# Patient Record
Sex: Female | Born: 1940 | Race: White | Hispanic: No | Marital: Married | Smoking: Never smoker
Health system: Southern US, Community
[De-identification: ages and names within clinical notes are randomized; demographics above are authoritative.]

## PROBLEM LIST (undated history)

## (undated) DIAGNOSIS — C541 Malignant neoplasm of endometrium: Secondary | ICD-10-CM

## (undated) DIAGNOSIS — M1990 Unspecified osteoarthritis, unspecified site: Secondary | ICD-10-CM

## (undated) DIAGNOSIS — D649 Anemia, unspecified: Secondary | ICD-10-CM

## (undated) DIAGNOSIS — F419 Anxiety disorder, unspecified: Secondary | ICD-10-CM

## (undated) DIAGNOSIS — M898X5 Other specified disorders of bone, thigh: Secondary | ICD-10-CM

## (undated) DIAGNOSIS — G8929 Other chronic pain: Secondary | ICD-10-CM

## (undated) DIAGNOSIS — M545 Other chronic pain: Secondary | ICD-10-CM

## (undated) DIAGNOSIS — C7951 Secondary malignant neoplasm of bone: Secondary | ICD-10-CM

## (undated) DIAGNOSIS — C50919 Malignant neoplasm of unspecified site of unspecified female breast: Secondary | ICD-10-CM

## (undated) DIAGNOSIS — I10 Essential (primary) hypertension: Secondary | ICD-10-CM

## (undated) DIAGNOSIS — M792 Neuralgia and neuritis, unspecified: Secondary | ICD-10-CM

## (undated) DIAGNOSIS — J189 Pneumonia, unspecified organism: Secondary | ICD-10-CM

## (undated) DIAGNOSIS — F418 Other specified anxiety disorders: Secondary | ICD-10-CM

## (undated) DIAGNOSIS — F32A Depression, unspecified: Secondary | ICD-10-CM

## (undated) DIAGNOSIS — F329 Major depressive disorder, single episode, unspecified: Secondary | ICD-10-CM

## (undated) DIAGNOSIS — G709 Myoneural disorder, unspecified: Secondary | ICD-10-CM

## (undated) DIAGNOSIS — E7800 Pure hypercholesterolemia, unspecified: Secondary | ICD-10-CM

## (undated) HISTORY — DX: Malignant neoplasm of unspecified site of unspecified female breast: C50.919

## (undated) HISTORY — DX: Unspecified osteoarthritis, unspecified site: M19.90

## (undated) HISTORY — DX: Anemia, unspecified: D64.9

## (undated) HISTORY — DX: Malignant neoplasm of endometrium: C54.1

## (undated) HISTORY — DX: Pneumonia, unspecified organism: J18.9

## (undated) HISTORY — DX: Anxiety disorder, unspecified: F41.9

## (undated) HISTORY — PX: HAND SURGERY: SHX662

## (undated) HISTORY — DX: Other specified disorders of bone, thigh: M89.8X5

## (undated) HISTORY — DX: Major depressive disorder, single episode, unspecified: F32.9

## (undated) HISTORY — DX: Other specified anxiety disorders: F41.8

## (undated) HISTORY — DX: Depression, unspecified: F32.A

## (undated) HISTORY — PX: TOTAL ABDOMINAL HYSTERECTOMY W/ BILATERAL SALPINGOOPHORECTOMY: SHX83

## (undated) HISTORY — DX: Neuralgia and neuritis, unspecified: M79.2

## (undated) HISTORY — DX: Secondary malignant neoplasm of bone: C79.51

---

## 2007-02-11 ENCOUNTER — Encounter: Payer: Self-pay | Admitting: Physical Medicine & Rehabilitation

## 2007-02-14 ENCOUNTER — Encounter: Payer: Self-pay | Admitting: Physical Medicine & Rehabilitation

## 2007-03-17 ENCOUNTER — Encounter: Payer: Self-pay | Admitting: Physical Medicine & Rehabilitation

## 2007-08-05 ENCOUNTER — Encounter: Payer: Self-pay | Admitting: Physical Medicine & Rehabilitation

## 2007-08-17 ENCOUNTER — Encounter: Payer: Self-pay | Admitting: Physical Medicine & Rehabilitation

## 2007-09-16 ENCOUNTER — Encounter: Payer: Self-pay | Admitting: Physical Medicine & Rehabilitation

## 2015-06-30 ENCOUNTER — Telehealth: Payer: Self-pay | Admitting: *Deleted

## 2015-06-30 NOTE — Telephone Encounter (Signed)
Received referral from pt's PCP requesting an appt w/ Dr. Jana Hakim.  Called and left a message for the pt to return my call so I can schedule her.

## 2015-07-01 ENCOUNTER — Telehealth: Payer: Self-pay | Admitting: *Deleted

## 2015-07-01 NOTE — Telephone Encounter (Signed)
Called pt and I confirmed 07/05/15 appt w/ her.  Unable to mail packet - gave verbal, directions, instruction and placed a note for an intake form to be given at time of check in.  Called and left a message at referring to make them aware.  Placed a copy of the records in Dr. Virgie Dad box and took one to HIM to scan.

## 2015-07-02 ENCOUNTER — Other Ambulatory Visit: Payer: Self-pay | Admitting: *Deleted

## 2015-07-02 DIAGNOSIS — C50919 Malignant neoplasm of unspecified site of unspecified female breast: Secondary | ICD-10-CM

## 2015-07-04 DIAGNOSIS — Z170 Estrogen receptor positive status [ER+]: Secondary | ICD-10-CM | POA: Insufficient documentation

## 2015-07-04 DIAGNOSIS — C50412 Malignant neoplasm of upper-outer quadrant of left female breast: Secondary | ICD-10-CM | POA: Insufficient documentation

## 2015-07-04 NOTE — Progress Notes (Signed)
[facility]  [phone] [phone]     ID: Angel Palmer Palmer DOB: 1941-02-26  MR#: [id number]  HGD#:[id number]  Patient Care Team: Angel Palmer Dana, MD as PCP - General (Internal Medicine) Angel Palmer Cruel, MD as Consulting Physician (Oncology) PCP: Angel Palmer Dana, MD GYN: Angel Palmer Drown MD, SU: Angel Palmer Keens MD OTHER MD: Angel Palmer Prows PA-C  CHIEF COMPLAINT: Estrogen receptor positive breast cancer; history of endometrial cancer  CURRENT TREATMENT: Undergoing evaluation   BREAST CANCER HISTORY: Angel Palmer Palmer had a little bit of itching in her left breast area which led her to palpate a mass there. She brought this to her primary care physician's attention. Left diagnostic mammography with ultrasonography at [facility] diagnostic imaging 06/07/2015 showed an area of asymmetry in the left breast which was not seen in exams from June 2015 or May 2013. A second area of asymmetry in the inner aspect of the left breast was stable. The breast density was category C. Ultrasound of the left breast showed a hypoechoic irregular mass at the 1:00 position near the edge of the areola, measuring 1.4 cm maximally. There was a fatty replaced lymph node in the axilla measuring 2 cm.  Biopsy of this area was obtained 06/09/2015, and showed ([id number]) and invasive ductal carcinoma with neuroendocrine features, grade 2, with no evidence of lymphovascular invasion. Estrogen and progesterone were both positive in greater than 50% of the nuclei. HER-2 stain was negative. E-cadherin was strongly positive. Angel Palmer 67 was 20%. P53 was weakly reactive in approximately 2% of the nuclei. Immunostains for neuroendocrine features showed week focal positivity for signed up to 57 and CD 56. It was nonreactive for chromogranin and neuron specific enolase  The patient's subsequent history is as detailed below   HISTORY OF ENDOMETRIAL CANCER: From Dr Angel Palmer Palmer 03/25/2015 summary  note: Angel Palmer Palmer underwent laparoscopically assisted vaginal hysterectomy, bilateral salpingo oophorectomy, and laparoscopic pelvic lymph node dissection in November, 1995. Final pathology revealed a moderately differentiated endometrioid adenocarcinoma. There was extensive involvement of the endometrial surface, but the maximal depth of invasion was less than 1 mm. Since she had Stage I-B, Grade 2, endometrial carcinoma with very minimal invasion and negative nodes, we elected not to treat her with any adjuvant therapy. She did well until January, 2000 when she developed right hip pain and was found to have a lytic lesion involving the right acetabulum. A CT scan showed 2 soft tissue masses in the pelvis, one of which was on the side wall involving the right hip, the second being more medially in the right peri-rectal space. Fine needle aspirate confirmed recurrence of endometrial cancer and she was treated with external pelvic radiation, which she completed in May, 2000. She had an excellent response to therapy with complete disappearance of the disease palpable on pelvic exam. Because of the bony involvement she has continued difficulties walking and uses a cane. In addition, she required intermittent narcotics for pain relief. She received adjuvant Megace therapy for 5 years.    INTERVAL HISTORY: Angel Palmer Palmer was evaluated in the breast clinic 07/05/2015 accompanied by her daughter Angel Palmer Palmer.:  REVIEW OF SYSTEMS: Aside from the mass itself, there have been no Angel Palmer symptoms recently of concern. She does have chronic low back pain for which she uses tramadol regularly and Percocet rarely. She is not constipated from these medications. She has been having diarrhea for some time, in a very irregular pattern. This does not seem to be related to different foods. She has had several  falls in the last 2 years. She thinks particularly her right toes sometimes stumbles. Of course she had significant radiation  to the pelvis and bones and this could rub percent a degree of neuropathy or myelopathy. She describes herself as frequently fatigued and describes the back pain as stabbing throbbing and aching. Aside from these issues she has sinus problems, shortness of breath particularly when walking up stairs, and concerns regarding skin cancers particularly in her for head but also in the area of prior radiation. A detailed review of systems otherwise showed no Angel Palmer symptoms.  PAST MEDICAL HISTORY: Past Medical History  Diagnosis Date  . Endometrial/uterine adenocarcinoma   . Metastasis to bone   . 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 Osteoarthritis   . Depression with anxiety      PAST SURGICAL HISTORY: Past Surgical History  Procedure Laterality Date  . Total abdominal hysterectomy w/ bilateral salpingoophorectomy      1995  . Hand surgery     FAMILY HISTORY No family history on file. the patient's father died from a stroke at age 46. The patient's mother died from colon cancer at age 84. The patient had one brother, 4 sisters. 2 of the patient's sisters had breast cancer, one had ovarian cancer diagnosed age 45 and the other one had endometrial cancer. Note that the patient's daughter has been tested for the BRCA mutation and is negative   GYNECOLOGIC HISTORY:  No LMP recorded. Menarche age 57, first live birth age 24. The patient underwent TAH/BSO in 1995. She did not take hormone replacement. 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 She is widowed and lives by herself with a long haired she while. Her daughter him early Angel Palmer Palmer lives in [place] and works as a Research scientist (physical sciences). She had noninvasive breast cancer diagnosed in 2014. The patient has 1 grandchild.    ADVANCED DIRECTIVES: The patient has a living will in place but  not a healthcare part of attorney documents. At the initial clinic visit 07/05/2015 the patient was given the appropriate forms to complete and notarize at her discretion.  HEALTH MAINTENANCE: Social History  Substance Use Topics  . Smoking status: Not on file  . Smokeless tobacco: Not on file  . Alcohol Use: Not on file     Colonoscopy: 2014  PAP: Status post hysterectomy  Bone density: 2012  Lipid panel:  Allergies not on file  No current outpatient prescriptions on file.   No current facility-administered medications for this visit.    OBJECTIVE: Older white Palmer in no acute distress Filed Vitals:   07/05/15 1611  BP: 149/76  Pulse: 81  Temp: 97.8 F (36.6 C)  Resp: 18     Body mass index is 20.22 kg/(m^2).    ECOG FS:1 - Symptomatic but completely ambulatory  Ocular: Sclerae unicteric, pupils equal, round and reactive to light Ear-nose-throat: Oropharynx clear and moist Lymphatic: No cervical or supraclavicular adenopathy Lungs no rales or rhonchi, good excursion bilaterally Heart regular rate and rhythm, no murmur appreciated Abd soft, nontender, positive bowel sounds MSK scoliosis but no focal spinal tenderness, no joint edema Neuro: non-focal, well-oriented, appropriate affect Breasts: The right breast is unremarkable. The left breast is status post recent biopsy. There is no palpable mass. There are no skin or nipple changes of concern. The left axilla is benign.  LAB RESULTS:  CMP     Component Value Date/Time   NA 143 07/05/2015 1530   K 3.9 07/05/2015 1530   CO2 30* 07/05/2015 1530   GLUCOSE 83 07/05/2015 1530   BUN 17.5 07/05/2015 1530   CREATININE 0.8 07/05/2015 1530   CALCIUM 9.2 07/05/2015 1530   PROT 6.4 07/05/2015 1530   ALBUMIN 3.6 07/05/2015 1530   AST 27 07/05/2015 1530   ALT 24 07/05/2015 1530   ALKPHOS 100 07/05/2015 1530   BILITOT 0.34 07/05/2015 1530    INo results found for: SPEP, UPEP  Lab Results  Component Value Date    WBC 6.8 07/05/2015   NEUTROABS 4.1 07/05/2015   HGB 11.1* 07/05/2015   HCT 34.8 07/05/2015   MCV 80.9 07/05/2015   PLT 195 07/05/2015      Chemistry      Component Value Date/Time   NA 143 07/05/2015 1530   K 3.9 07/05/2015 1530   CO2 30* 07/05/2015 1530   BUN 17.5 07/05/2015 1530   CREATININE 0.8 07/05/2015 1530      Component Value Date/Time   CALCIUM 9.2 07/05/2015 1530   ALKPHOS 100 07/05/2015 1530   AST 27 07/05/2015 1530   ALT 24 07/05/2015 1530   BILITOT 0.34 07/05/2015 1530       No results found for: LABCA2  No components found for: LABCA125  No results for input(s): INR in the last 168 hours.  Urinalysis No results found for: COLORURINE, APPEARANCEUR, LABSPEC, PHURINE, GLUCOSEU, HGBUR, BILIRUBINUR, KETONESUR, PROTEINUR, UROBILINOGEN, NITRITE, LEUKOCYTESUR  STUDIES: Outside studies reviewed  ASSESSMENT: 74 y.o. Angel Palmer Palmer, Angel Palmer Palmer  (1) genetics testing pending  (2) status post TAH/BSO in 1995 for a stage IB, grade 2 endometrial cancer  (a) biopsy of lytic bone lesion in 2000 confirmed metastatic recurrence  (b) status post pelvic irradiation completed May 2000  (c) on adjuvant Megace for 5 years, completed 2005  (3) status post left breast biopsy 06/09/2015, for a clinical T1c N1, stage II a invasive ductal carcinoma with neuroendocrine features, estrogen and progesterone receptor positive, HER-2 not amplified, with an MIB-1 of 20%  (4) definitive surgery pending  (5) Oncotype or Mammaprint to help decide the chemotherapy question  (6) adjuvant radiation to follow surgery as appropriate  (7) adjuvant estrogens to follow local treatment  PLAN: We spent approximately one hour going over the biology of breast cancer in general and the details of the patient's cancer in particular. She understands that breast cancer treatment is best divided into local therapies and systemic therapies.   The options for local therapy are surgery and radiation. With  regards to surgery there is no survival advantage to mastectomy as opposed to lumpectomy plus radiation. Accordingly breast conserving surgery with sentinel lymph node sampling followed by radiation is the standard of care in most cases. We are going to obtain a breast MRI to more specifically delineate the extent of her tumor, and she will discuss surgical options with Dr. Ninfa Linden when she sees him following that test.  Because there is always some risk the breast cancer cells may have already spread  outside the beast area, systemic therapy is indicated. These treatments, unlike local therapies, go everywhere in the patient's body. There are 3 basic options: anti-estrogen pills, anti-HER-2 immunotherapy, and chemotherapy.   We reviewed the prognostic panel results from her tumor and she clearly will benefit from anti-estrogen pills. These are inexpensive, generally well tolerated, and are the single most effective treatment to decrease recurrence risk.  In cases like this, where the tumor cells do not overexpress HER-2, anti-HER-2 immunotherapy is not indicated.  Chemotherapy is an option. However, in cases like this where the benefit of chemotherapy is likely to be borderline we will request an OncotypeDX test or Mammaprint, to help [place] quantify the possible chemotherapy benefit.   Because of the patient's prior metastatic endometrial cancer as well as the difficulty of evaluating some of her symptoms (back pain, for example, which is likely myelopathic from her prior treatment but which could be also a sign of metastatic disease to bone) she warrants formal restaging and I asked scheduled her for CT scans of the chest abdomen and pelvis without in mind.  Savonna also qualifies for genetics testing, even though her daughter was found to be BRCA positive. The possibility of Lynch syndrome is very real in this family quite aside from BRCA mutations. Accordingly a referral to genetics has also been  placed.  Jaimy has a good understanding of the overall plan. She agrees with it. She knows the goal of treatment in her case is cure. She will call with any problems that may develop before her next visit here.  Angel Palmer Cruel, MD   07/05/2015 7:11 PM Medical Oncology and Hematology [facility] [address] [place], [place] [address] Tel. [phone]    Fax. [phone]

## 2015-07-05 ENCOUNTER — Other Ambulatory Visit (HOSPITAL_BASED_OUTPATIENT_CLINIC_OR_DEPARTMENT_OTHER): Payer: Medicare Other

## 2015-07-05 ENCOUNTER — Encounter: Payer: Self-pay | Admitting: Oncology

## 2015-07-05 ENCOUNTER — Ambulatory Visit (HOSPITAL_BASED_OUTPATIENT_CLINIC_OR_DEPARTMENT_OTHER): Payer: Medicare Other | Admitting: Oncology

## 2015-07-05 VITALS — BP 149/76 | HR 81 | Temp 97.8°F | Resp 18 | Ht 65.0 in | Wt 121.5 lb

## 2015-07-05 DIAGNOSIS — C541 Malignant neoplasm of endometrium: Secondary | ICD-10-CM | POA: Insufficient documentation

## 2015-07-05 DIAGNOSIS — C50919 Malignant neoplasm of unspecified site of unspecified female breast: Secondary | ICD-10-CM | POA: Diagnosis present

## 2015-07-05 DIAGNOSIS — C50912 Malignant neoplasm of unspecified site of left female breast: Secondary | ICD-10-CM | POA: Diagnosis present

## 2015-07-05 LAB — COMPREHENSIVE METABOLIC PANEL (CC13)
ALBUMIN: 3.6 g/dL (ref 3.5–5.0)
ALT: 24 U/L (ref 0–55)
AST: 27 U/L (ref 5–34)
Alkaline Phosphatase: 100 U/L (ref 40–150)
Anion Gap: 7 mEq/L (ref 3–11)
BILIRUBIN TOTAL: 0.34 mg/dL (ref 0.20–1.20)
BUN: 17.5 mg/dL (ref 7.0–26.0)
CO2: 30 meq/L — AB (ref 22–29)
CREATININE: 0.8 mg/dL (ref 0.6–1.1)
Calcium: 9.2 mg/dL (ref 8.4–10.4)
Chloride: 107 mEq/L (ref 98–109)
EGFR: 72 mL/min/{1.73_m2} — AB (ref >90)
GLUCOSE: 83 mg/dL (ref 70–140)
Potassium: 3.9 mEq/L (ref 3.5–5.1)
SODIUM: 143 meq/L (ref 136–145)
TOTAL PROTEIN: 6.4 g/dL (ref 6.4–8.3)

## 2015-07-05 LAB — CBC WITH DIFFERENTIAL/PLATELET
BASO%: 0.6 % (ref 0.0–2.0)
BASOS ABS: 0 10*3/uL (ref 0.0–0.1)
EOS ABS: 0.3 10*3/uL (ref 0.0–0.5)
EOS%: 4.9 % (ref 0.0–7.0)
HCT: 34.8 % (ref 34.8–46.6)
HEMOGLOBIN: 11.1 g/dL — AB (ref 11.6–15.9)
LYMPH#: 1.6 10*3/uL (ref 0.9–3.3)
LYMPH%: 23.7 % (ref 14.0–49.7)
MCH: 25.8 pg (ref 25.1–34.0)
MCHC: 31.9 g/dL (ref 31.5–36.0)
MCV: 80.9 fL (ref 79.5–101.0)
MONO#: 0.7 10*3/uL (ref 0.1–0.9)
MONO%: 10.3 % (ref 0.0–14.0)
NEUT%: 60.5 % (ref 38.4–76.8)
NEUTROS ABS: 4.1 10*3/uL (ref 1.5–6.5)
Platelets: 195 10*3/uL (ref 145–400)
RBC: 4.3 10*6/uL (ref 3.70–5.45)
RDW: 14.5 % (ref 11.2–14.5)
WBC: 6.8 10*3/uL (ref 3.9–10.3)

## 2015-07-06 ENCOUNTER — Telehealth: Payer: Self-pay | Admitting: Oncology

## 2015-07-06 ENCOUNTER — Other Ambulatory Visit: Payer: Self-pay

## 2015-07-06 DIAGNOSIS — C541 Malignant neoplasm of endometrium: Secondary | ICD-10-CM

## 2015-07-06 DIAGNOSIS — C50912 Malignant neoplasm of unspecified site of left female breast: Secondary | ICD-10-CM

## 2015-07-06 NOTE — Telephone Encounter (Signed)
Confirmed appointment for October and November. Patient aware she will be receiving call from [place] office to schedule new patient appointment. Also gave information to [facility] to schedule MRI. Also informed patient of missed cal from radiology to schedule CT scan. She will call and schedule both scans.

## 2015-07-08 ENCOUNTER — Ambulatory Visit
Admission: RE | Admit: 2015-07-08 | Discharge: 2015-07-08 | Disposition: A | Discharge status: Home / Self Care | Payer: Medicare Other | Source: Ambulatory Visit | Attending: Oncology | Admitting: Oncology

## 2015-07-08 DIAGNOSIS — C541 Malignant neoplasm of endometrium: Secondary | ICD-10-CM

## 2015-07-08 DIAGNOSIS — C50912 Malignant neoplasm of unspecified site of left female breast: Secondary | ICD-10-CM

## 2015-07-08 MED ORDER — GADOBENATE DIMEGLUMINE 529 MG/ML IV SOLN
11.0000 mL | Freq: Once | INTRAVENOUS | Status: AC | PRN
  Administered 2015-07-08: 11 mL via INTRAVENOUS

## 2015-07-12 ENCOUNTER — Other Ambulatory Visit: Payer: Self-pay | Admitting: Oncology

## 2015-07-14 ENCOUNTER — Encounter (HOSPITAL_COMMUNITY): Payer: Self-pay

## 2015-07-14 ENCOUNTER — Other Ambulatory Visit: Payer: Self-pay | Admitting: Oncology

## 2015-07-14 ENCOUNTER — Ambulatory Visit (HOSPITAL_COMMUNITY)
Admission: RE | Admit: 2015-07-14 | Discharge: 2015-07-14 | Disposition: A | Discharge status: Home / Self Care | Payer: Medicare Other | Source: Ambulatory Visit | Attending: Oncology | Admitting: Oncology

## 2015-07-14 DIAGNOSIS — C50912 Malignant neoplasm of unspecified site of left female breast: Secondary | ICD-10-CM | POA: Diagnosis not present

## 2015-07-14 DIAGNOSIS — C541 Malignant neoplasm of endometrium: Secondary | ICD-10-CM

## 2015-07-14 MED ORDER — IOHEXOL 300 MG/ML  SOLN
100.0000 mL | Freq: Once | INTRAMUSCULAR | Status: AC | PRN
  Administered 2015-07-14: 80 mL via INTRAVENOUS

## 2015-07-23 ENCOUNTER — Other Ambulatory Visit: Payer: Self-pay | Admitting: Surgery

## 2015-07-23 DIAGNOSIS — C50912 Malignant neoplasm of unspecified site of left female breast: Secondary | ICD-10-CM

## 2015-07-26 ENCOUNTER — Telehealth: Payer: Self-pay | Admitting: *Deleted

## 2015-07-26 NOTE — Telephone Encounter (Signed)
Received referral from Dr. Trevor Mace office.  Spoke with patient.  She prefers to be seen in [place]  Will notify Dr. Trevor Mace office

## 2015-07-27 ENCOUNTER — Encounter: Payer: Medicare Other | Admitting: Genetic Counselor

## 2015-07-27 ENCOUNTER — Other Ambulatory Visit: Payer: Medicare Other

## 2015-07-28 ENCOUNTER — Other Ambulatory Visit: Payer: Medicare Other

## 2015-07-28 ENCOUNTER — Encounter: Payer: Medicare Other | Admitting: Genetic Counselor

## 2015-07-28 ENCOUNTER — Encounter (HOSPITAL_BASED_OUTPATIENT_CLINIC_OR_DEPARTMENT_OTHER): Payer: Self-pay | Admitting: *Deleted

## 2015-08-02 ENCOUNTER — Other Ambulatory Visit: Payer: Self-pay

## 2015-08-02 ENCOUNTER — Ambulatory Visit
Admission: RE | Admit: 2015-08-02 | Discharge: 2015-08-02 | Disposition: A | Discharge status: Home / Self Care | Payer: Medicare Other | Source: Ambulatory Visit | Attending: Surgery | Admitting: Surgery

## 2015-08-02 ENCOUNTER — Encounter (HOSPITAL_BASED_OUTPATIENT_CLINIC_OR_DEPARTMENT_OTHER)
Admission: RE | Admit: 2015-08-02 | Discharge: 2015-08-02 | Disposition: A | Discharge status: Home / Self Care | Payer: Medicare Other | Source: Ambulatory Visit | Attending: Surgery | Admitting: Surgery

## 2015-08-02 DIAGNOSIS — D0512 Intraductal carcinoma in situ of left breast: Secondary | ICD-10-CM | POA: Diagnosis present

## 2015-08-02 DIAGNOSIS — C50912 Malignant neoplasm of unspecified site of left female breast: Secondary | ICD-10-CM

## 2015-08-02 DIAGNOSIS — I10 Essential (primary) hypertension: Secondary | ICD-10-CM | POA: Diagnosis not present

## 2015-08-03 NOTE — H&P (Signed)
ean H. Kirchgessner 07/23/2015 10:51 AM Location: [facility] Patient #: [id number] DOB: 18-Sep-1941 Widowed / Language: Cleophus Molt / Race: White Female   History of Present Illness (Rebecca Cairns A. Ninfa Linden MD; 07/23/2015 11:14 AM) The patient is a 74 year old female who presents with breast cancer. This is a very pleasant female referred by Dr. Jana Hakim after the recent diagnosis of a stage I upper-outer breast cancer on the left side. This was found on screening mammography. She has a previous history of metastatic endometrial cancer. She has had no problems regarding her breast previously. She denies nipple discharge. She is otherwise without complaints.   Other Problems Elbert Ewings, CMA; 07/23/2015 10:51 AM) Back Pain Hemorrhoids High blood pressure Migraine Headache Oophorectomy  Past Surgical History Elbert Ewings, CMA; 07/23/2015 10:51 AM) Breast Biopsy Left. Cataract Surgery Left. Hysterectomy (due to cancer) - Complete  Diagnostic Studies History Elbert Ewings, CMA; 07/23/2015 10:51 AM) Colonoscopy 1-5 years ago Mammogram 1-3 years ago  Allergies Elbert Ewings, CMA; 07/23/2015 10:53 AM) Ciprofloxacin HCl *OTIC AGENTS* Penicillin V *PENICILLINS* Rash.  Medication History Elbert Ewings, CMA; 07/23/2015 10:53 AM) Oxycodone-Acetaminophen (5-325MG  Tablet, Oral) Active. TraMADol HCl (50MG  Tablet, Oral) Active. DULoxetine HCl (30MG  Capsule DR Part, Oral) Active. Pravastatin Sodium (10MG  Tablet, Oral) Active. Nasacort (55MCG/ACT Inhaler, Nasal) Active. Calcium 600 (1500 (600 Ca)MG Tablet, Oral) Active. AmLODIPine Besylate (5MG  Tablet, Oral) Active. Medications Reconciled  Social History Elbert Ewings, [place]; 07/23/2015 10:51 AM) Caffeine use Carbonated beverages. No alcohol use No drug use Tobacco use Never smoker.  Family History Elbert Ewings, [place]; 07/23/2015 10:51 AM) Arthritis Brother. Breast Cancer Daughter, Sister. Cervical Cancer  Sister. Colon Cancer Mother, Sister. Heart Disease Father, Sister. Hypertension Daughter, Mother. Migraine Headache Daughter. Ovarian Cancer Sister.  Pregnancy / Birth History Elbert Ewings, CMA; 07/23/2015 10:51 AM) Age at menarche 68 years. Age of menopause 15-50 Gravida 1 Maternal age 56-25 Para 1    Review of Systems Elbert Ewings CMA; 07/23/2015 10:51 AM) General Present- Fatigue. Not Present- Appetite Loss, Chills, Fever, Night Sweats, Weight Gain and Weight Loss. Skin Present- Dryness. Not Present- Change in Wart/Mole, Hives, Jaundice, New Lesions, Non-Healing Wounds, Rash and Ulcer. HEENT Present- Sinus Pain and Wears glasses/contact lenses. Not Present- Earache, Hearing Loss, Hoarseness, Nose Bleed, Oral Ulcers, Ringing in the Ears, Seasonal Allergies, Sore Throat, Visual Disturbances and Yellow Eyes. Respiratory Present- Chronic Cough. Not Present- Bloody sputum, Difficulty Breathing, Snoring and Wheezing. Breast Present- Breast Mass and Skin Changes. Not Present- Breast Pain and Nipple Discharge. Cardiovascular Present- Leg Cramps. Not Present- Chest Pain, Difficulty Breathing Lying Down, Palpitations, Rapid Heart Rate, Shortness of Breath and Swelling of Extremities. Gastrointestinal Present- Chronic diarrhea, Hemorrhoids and Rectal Pain. Not Present- Abdominal Pain, Bloating, Bloody Stool, Change in Bowel Habits, Constipation, Difficulty Swallowing, Excessive gas, Gets full quickly at meals, Indigestion, Nausea and Vomiting. Female Genitourinary Present- Urgency. Not Present- Frequency, Nocturia, Painful Urination and Pelvic Pain. Musculoskeletal Present- Back Pain, Joint Pain and Muscle Weakness. Not Present- Joint Stiffness, Muscle Pain and Swelling of Extremities. Neurological Present- Tingling. Not Present- Decreased Memory, Fainting, Headaches, Numbness, Seizures, Tremor, Trouble walking and Weakness. Psychiatric Not Present- Anxiety, Bipolar, Change in Sleep  Pattern, Depression, Fearful and Frequent crying. Endocrine Present- Cold Intolerance. Not Present- Excessive Hunger, Hair Changes, Heat Intolerance, Hot flashes and New Diabetes. Hematology Not Present- Easy Bruising, Excessive bleeding, Gland problems, HIV and Persistent Infections.  Vitals Elbert Ewings CMA; 07/23/2015 10:54 AM) 07/23/2015 10:54 AM Weight: 121 lb Height: 65in Body Surface Area: 1.59 m Body Mass Index: 20.14  kg/m  Temp.: 64F(Temporal)  Pulse: 83 (Regular)  BP: 126/64 (Sitting, Left Arm, Standard)     Physical Exam (Kitana Gage A. Ninfa Linden MD; 07/23/2015 11:14 AM) General Mental Status-Alert. General Appearance-Consistent with stated age. Hydration-Well hydrated. Voice-Normal.  Head and Neck Head-normocephalic, atraumatic with no lesions or palpable masses. Trachea-midline. Thyroid Gland Characteristics - normal size and consistency.  Eye Eyeball - Bilateral-Extraocular movements intact. Sclera/Conjunctiva - Bilateral-No scleral icterus.  Chest and Lung Exam Chest and lung exam reveals -quiet, even and easy respiratory effort with no use of accessory muscles and on auscultation, normal breath sounds, no adventitious sounds and normal vocal resonance. Inspection Chest Wall - Normal. Back - normal.  Breast Breast - Left-Symmetric and Biopsy scar, Non Tender, No Dimpling, No Inflammation, No Lumpectomy scars, No Mastectomy scars, No Peau d' Orange. Note: There is a vague fullness in the upper outer quadrant near the areola and the biopsy site which may be a resolving hematoma Breast - Right-Symmetric, Non Tender, No Biopsy scars, no Dimpling, No Inflammation, No Lumpectomy scars, No Mastectomy scars, No Peau d' Orange. Breast Lump-No Palpable Breast Mass.  Cardiovascular Cardiovascular examination reveals -normal heart sounds, regular rate and rhythm with no murmurs and normal pedal pulses  bilaterally.  Abdomen Inspection Inspection of the abdomen reveals - No Hernias. Skin - Scar - no surgical scars. Palpation/Percussion Palpation and Percussion of the abdomen reveal - Soft, Non Tender, No Rebound tenderness, No Rigidity (guarding) and No hepatosplenomegaly. Auscultation Auscultation of the abdomen reveals - Bowel sounds normal.  Neurologic Neurologic evaluation reveals -alert and oriented x 3 with no impairment of recent or remote memory. Mental Status-Normal.  Musculoskeletal Normal Exam - Left-Upper Extremity Strength Normal and Lower Extremity Strength Normal. Normal Exam - Right-Upper Extremity Strength Normal and Lower Extremity Strength Normal.  Lymphatic Head & Neck  General Head & Neck Lymphatics: Bilateral - Description - Normal. Axillary  General Axillary Region: Bilateral - Description - Normal. Tenderness - Non Tender. Femoral & Inguinal  Generalized Femoral & Inguinal Lymphatics: Bilateral - Description - Normal. Tenderness - Non Tender.    Assessment & Plan (Virgil Lightner A. Ninfa Linden MD; 07/23/2015 11:17 AM) BREAST CANCER, STAGE 1, LEFT (C50.912) Impression: I have reviewed her MRI showing that this is a focal 1.6 cm left breast cancer. There are no enlarged lymph nodes. The pathology showed an invasive ductal carcinoma which was ER/PR positive. After seeing the medical oncologist, she has already decided she wants a radioactive seed localized left breast lumpectomy and some lymph node biopsy with breast conservation. She is going to be undergoing genetic testing but says this will probably not change her decision which I feel is reasonable. I discussed the surgical procedure with her and her daughter. I discussed the risk of surgery which includes but is not limited to bleeding, infection, injury to surrounding structures, need for further surgery if the margins or lymph nodes are positive, cardiopulmonary issues, DVT, postoperative recovery, etc. She  understands and wishes to proceed with surgery which will be scheduled.

## 2015-08-04 ENCOUNTER — Ambulatory Visit (HOSPITAL_BASED_OUTPATIENT_CLINIC_OR_DEPARTMENT_OTHER): Payer: Medicare Other | Admitting: Anesthesiology

## 2015-08-04 ENCOUNTER — Ambulatory Visit
Admission: RE | Admit: 2015-08-04 | Discharge: 2015-08-04 | Disposition: A | Discharge status: Home / Self Care | Payer: Medicare Other | Source: Ambulatory Visit | Attending: Surgery | Admitting: Surgery

## 2015-08-04 ENCOUNTER — Encounter (HOSPITAL_BASED_OUTPATIENT_CLINIC_OR_DEPARTMENT_OTHER): Payer: Self-pay | Admitting: Anesthesiology

## 2015-08-04 ENCOUNTER — Encounter (HOSPITAL_BASED_OUTPATIENT_CLINIC_OR_DEPARTMENT_OTHER)
Admission: RE | Disposition: A | Discharge status: Home / Self Care | Payer: Self-pay | Source: Ambulatory Visit | Attending: Surgery

## 2015-08-04 ENCOUNTER — Ambulatory Visit (HOSPITAL_BASED_OUTPATIENT_CLINIC_OR_DEPARTMENT_OTHER)
Admission: RE | Admit: 2015-08-04 | Discharge: 2015-08-04 | Disposition: A | Discharge status: Home / Self Care | Payer: Medicare Other | Source: Ambulatory Visit | Attending: Surgery | Admitting: Surgery

## 2015-08-04 ENCOUNTER — Ambulatory Visit (HOSPITAL_COMMUNITY)
Admission: RE | Admit: 2015-08-04 | Discharge: 2015-08-04 | Disposition: A | Discharge status: Home / Self Care | Payer: Medicare Other | Source: Ambulatory Visit | Attending: Surgery | Admitting: Surgery

## 2015-08-04 DIAGNOSIS — D0512 Intraductal carcinoma in situ of left breast: Secondary | ICD-10-CM | POA: Insufficient documentation

## 2015-08-04 DIAGNOSIS — C50912 Malignant neoplasm of unspecified site of left female breast: Secondary | ICD-10-CM

## 2015-08-04 DIAGNOSIS — I10 Essential (primary) hypertension: Secondary | ICD-10-CM | POA: Insufficient documentation

## 2015-08-04 HISTORY — DX: Low back pain: M54.5

## 2015-08-04 HISTORY — DX: Other chronic pain: M54.50

## 2015-08-04 HISTORY — DX: Pure hypercholesterolemia, unspecified: E78.00

## 2015-08-04 HISTORY — PX: BREAST LUMPECTOMY WITH RADIOACTIVE SEED AND SENTINEL LYMPH NODE BIOPSY: SHX6550

## 2015-08-04 HISTORY — DX: Essential (primary) hypertension: I10

## 2015-08-04 HISTORY — DX: Myoneural disorder, unspecified: G70.9

## 2015-08-04 HISTORY — DX: Other chronic pain: G89.29

## 2015-08-04 LAB — POCT HEMOGLOBIN-HEMACUE: Hemoglobin: 11.1 g/dL — ABNORMAL LOW (ref 12.0–15.0)

## 2015-08-04 SURGERY — BREAST LUMPECTOMY WITH RADIOACTIVE SEED AND SENTINEL LYMPH NODE BIOPSY
Anesthesia: General | Site: Breast | Laterality: Left

## 2015-08-04 MED ORDER — OXYCODONE-ACETAMINOPHEN 5-325 MG PO TABS: 1.0000 | ORAL_TABLET | ORAL | Status: DC | PRN

## 2015-08-04 MED ORDER — VANCOMYCIN HCL IN DEXTROSE 1-5 GM/200ML-% IV SOLN
INTRAVENOUS | Status: AC
Start: 2015-08-04 — End: 2015-08-04
  Filled 2015-08-04: qty 200

## 2015-08-04 MED ORDER — BUPIVACAINE-EPINEPHRINE 0.5% -1:200000 IJ SOLN
INTRAMUSCULAR | Status: DC | PRN
  Administered 2015-08-04: 18 mL

## 2015-08-04 MED ORDER — OXYCODONE HCL 5 MG PO TABS
5.0000 mg | ORAL_TABLET | Freq: Once | ORAL | Status: AC | PRN
  Administered 2015-08-04: 5 mg via ORAL

## 2015-08-04 MED ORDER — GLYCOPYRROLATE 0.2 MG/ML IJ SOLN: 0.2000 mg | Freq: Once | INTRAMUSCULAR | Status: DC | PRN

## 2015-08-04 MED ORDER — SCOPOLAMINE 1 MG/3DAYS TD PT72: 1.0000 | MEDICATED_PATCH | Freq: Once | TRANSDERMAL | Status: DC | PRN

## 2015-08-04 MED ORDER — ONDANSETRON HCL 4 MG/2ML IJ SOLN
INTRAMUSCULAR | Status: DC | PRN
  Administered 2015-08-04: 4 mg via INTRAVENOUS

## 2015-08-04 MED ORDER — FENTANYL CITRATE (PF) 100 MCG/2ML IJ SOLN
INTRAMUSCULAR | Status: AC
  Filled 2015-08-04: qty 2

## 2015-08-04 MED ORDER — HYDROMORPHONE HCL 1 MG/ML IJ SOLN: 0.2500 mg | INTRAMUSCULAR | Status: DC | PRN

## 2015-08-04 MED ORDER — PROPOFOL 10 MG/ML IV BOLUS
INTRAVENOUS | Status: AC
  Filled 2015-08-04: qty 20

## 2015-08-04 MED ORDER — ONDANSETRON HCL 4 MG/2ML IJ SOLN: 4.0000 mg | Freq: Once | INTRAMUSCULAR | Status: DC | PRN

## 2015-08-04 MED ORDER — BUPIVACAINE-EPINEPHRINE (PF) 0.5% -1:200000 IJ SOLN
INTRAMUSCULAR | Status: DC | PRN
  Administered 2015-08-04: 30 mL

## 2015-08-04 MED ORDER — MIDAZOLAM HCL 2 MG/2ML IJ SOLN
INTRAMUSCULAR | Status: AC
  Filled 2015-08-04: qty 4

## 2015-08-04 MED ORDER — MEPERIDINE HCL 25 MG/ML IJ SOLN: 6.2500 mg | INTRAMUSCULAR | Status: DC | PRN

## 2015-08-04 MED ORDER — LACTATED RINGERS IV SOLN
INTRAVENOUS | Status: DC
  Administered 2015-08-04 (×2): via INTRAVENOUS

## 2015-08-04 MED ORDER — LIDOCAINE HCL (CARDIAC) 20 MG/ML IV SOLN
INTRAVENOUS | Status: DC | PRN
  Administered 2015-08-04: 50 mg via INTRAVENOUS

## 2015-08-04 MED ORDER — METHYLENE BLUE 1 % INJ SOLN
INTRAMUSCULAR | Status: AC
  Filled 2015-08-04: qty 10

## 2015-08-04 MED ORDER — DEXAMETHASONE SODIUM PHOSPHATE 4 MG/ML IJ SOLN
INTRAMUSCULAR | Status: DC | PRN
  Administered 2015-08-04: 10 mg via INTRAVENOUS

## 2015-08-04 MED ORDER — PROPOFOL 500 MG/50ML IV EMUL
INTRAVENOUS | Status: AC
  Filled 2015-08-04: qty 50

## 2015-08-04 MED ORDER — VANCOMYCIN HCL IN DEXTROSE 1-5 GM/200ML-% IV SOLN
1000.0000 mg | INTRAVENOUS | Status: AC
  Administered 2015-08-04 (×2): 1000 mg via INTRAVENOUS

## 2015-08-04 MED ORDER — OXYCODONE HCL 5 MG PO TABS
ORAL_TABLET | ORAL | Status: AC
  Filled 2015-08-04: qty 1

## 2015-08-04 MED ORDER — DEXAMETHASONE SODIUM PHOSPHATE 10 MG/ML IJ SOLN
INTRAMUSCULAR | Status: AC
  Filled 2015-08-04: qty 1

## 2015-08-04 MED ORDER — TECHNETIUM TC 99M SULFUR COLLOID FILTERED
1.0000 | Freq: Once | INTRAVENOUS | Status: AC | PRN
  Administered 2015-08-04: 1 via INTRADERMAL

## 2015-08-04 MED ORDER — FENTANYL CITRATE (PF) 100 MCG/2ML IJ SOLN
INTRAMUSCULAR | Status: DC | PRN
  Administered 2015-08-04 (×2): 25 ug via INTRAVENOUS
  Administered 2015-08-04: 50 ug via INTRAVENOUS

## 2015-08-04 MED ORDER — FENTANYL CITRATE (PF) 100 MCG/2ML IJ SOLN
INTRAMUSCULAR | Status: AC
  Filled 2015-08-04: qty 4

## 2015-08-04 MED ORDER — ONDANSETRON HCL 4 MG/2ML IJ SOLN
INTRAMUSCULAR | Status: AC
  Filled 2015-08-04: qty 2

## 2015-08-04 MED ORDER — LIDOCAINE HCL (CARDIAC) 20 MG/ML IV SOLN
INTRAVENOUS | Status: AC
  Filled 2015-08-04: qty 5

## 2015-08-04 MED ORDER — PROPOFOL 10 MG/ML IV BOLUS
INTRAVENOUS | Status: DC | PRN
  Administered 2015-08-04: 150 mg via INTRAVENOUS
  Administered 2015-08-04: 50 mg via INTRAVENOUS

## 2015-08-04 MED ORDER — FENTANYL CITRATE (PF) 100 MCG/2ML IJ SOLN
50.0000 ug | INTRAMUSCULAR | Status: DC | PRN
  Administered 2015-08-04: 50 ug via INTRAVENOUS

## 2015-08-04 MED ORDER — MIDAZOLAM HCL 2 MG/2ML IJ SOLN
INTRAMUSCULAR | Status: AC
  Filled 2015-08-04: qty 2

## 2015-08-04 MED ORDER — MIDAZOLAM HCL 2 MG/2ML IJ SOLN
1.0000 mg | INTRAMUSCULAR | Status: DC | PRN
  Administered 2015-08-04: 1 mg via INTRAVENOUS

## 2015-08-04 MED ORDER — SODIUM CHLORIDE 0.9 % IJ SOLN
INTRAMUSCULAR | Status: AC
  Filled 2015-08-04: qty 10

## 2015-08-04 MED ORDER — BUPIVACAINE-EPINEPHRINE (PF) 0.5% -1:200000 IJ SOLN
INTRAMUSCULAR | Status: AC
  Filled 2015-08-04: qty 30

## 2015-08-04 SURGICAL SUPPLY — 47 items
APPLIER CLIP 9.375 MED OPEN (MISCELLANEOUS) ×3
BLADE HEX COATED 2.75 (ELECTRODE) ×3 IMPLANT
BLADE SURG 15 STRL LF DISP TIS (BLADE) ×1 IMPLANT
BLADE SURG 15 STRL SS (BLADE) ×2
CANISTER SUCT 1200ML W/VALVE (MISCELLANEOUS) IMPLANT
CHLORAPREP W/TINT 26ML (MISCELLANEOUS) ×3 IMPLANT
CLIP APPLIE 9.375 MED OPEN (MISCELLANEOUS) ×1 IMPLANT
CLIP TI WIDE RED SMALL 6 (CLIP) IMPLANT
COVER BACK TABLE 60X90IN (DRAPES) ×3 IMPLANT
COVER MAYO STAND STRL (DRAPES) ×3 IMPLANT
COVER PROBE W GEL 5X96 (DRAPES) ×3 IMPLANT
DECANTER SPIKE VIAL GLASS SM (MISCELLANEOUS) IMPLANT
DEVICE DUBIN W/COMP PLATE 8390 (MISCELLANEOUS) ×3 IMPLANT
DRAPE LAPAROSCOPIC ABDOMINAL (DRAPES) ×3 IMPLANT
DRAPE UTILITY XL STRL (DRAPES) ×3 IMPLANT
ELECT REM PT RETURN 9FT ADLT (ELECTROSURGICAL) ×3
ELECTRODE REM PT RTRN 9FT ADLT (ELECTROSURGICAL) ×1 IMPLANT
GLOVE BIO SURGEON STRL SZ 6.5 (GLOVE) ×2 IMPLANT
GLOVE BIO SURGEONS STRL SZ 6.5 (GLOVE) ×1
GLOVE BIOGEL PI IND STRL 7.0 (GLOVE) ×2 IMPLANT
GLOVE BIOGEL PI INDICATOR 7.0 (GLOVE) ×4
GLOVE SURG SIGNA 7.5 PF LTX (GLOVE) ×3 IMPLANT
GOWN STRL REUS W/ TWL LRG LVL3 (GOWN DISPOSABLE) ×1 IMPLANT
GOWN STRL REUS W/ TWL XL LVL3 (GOWN DISPOSABLE) ×1 IMPLANT
GOWN STRL REUS W/TWL LRG LVL3 (GOWN DISPOSABLE) ×2
GOWN STRL REUS W/TWL XL LVL3 (GOWN DISPOSABLE) ×2
KIT MARKER MARGIN INK (KITS) ×3 IMPLANT
LIQUID BAND (GAUZE/BANDAGES/DRESSINGS) ×3 IMPLANT
NDL SAFETY ECLIPSE 18X1.5 (NEEDLE) IMPLANT
NEEDLE HYPO 18GX1.5 SHARP (NEEDLE)
NEEDLE HYPO 25X1 1.5 SAFETY (NEEDLE) ×3 IMPLANT
NS IRRIG 1000ML POUR BTL (IV SOLUTION) ×3 IMPLANT
PACK BASIN DAY SURGERY FS (CUSTOM PROCEDURE TRAY) ×3 IMPLANT
PENCIL BUTTON HOLSTER BLD 10FT (ELECTRODE) ×3 IMPLANT
SLEEVE SCD COMPRESS KNEE MED (MISCELLANEOUS) ×3 IMPLANT
SPONGE GAUZE 4X4 12PLY STER LF (GAUZE/BANDAGES/DRESSINGS) IMPLANT
SPONGE LAP 4X18 X RAY DECT (DISPOSABLE) ×3 IMPLANT
SUT MNCRL AB 4-0 PS2 18 (SUTURE) ×3 IMPLANT
SUT SILK 2 0 SH (SUTURE) ×3 IMPLANT
SUT VIC AB 3-0 SH 27 (SUTURE) ×2
SUT VIC AB 3-0 SH 27X BRD (SUTURE) ×1 IMPLANT
SYR CONTROL 10ML LL (SYRINGE) ×3 IMPLANT
TOWEL OR 17X24 6PK STRL BLUE (TOWEL DISPOSABLE) ×3 IMPLANT
TOWEL OR NON WOVEN STRL DISP B (DISPOSABLE) ×3 IMPLANT
TUBE CONNECTING 20'X1/4 (TUBING)
TUBE CONNECTING 20X1/4 (TUBING) IMPLANT
YANKAUER SUCT BULB TIP NO VENT (SUCTIONS) IMPLANT

## 2015-08-04 NOTE — Interval H&P Note (Signed)
History and Physical Interval Note:no change in H and P  08/04/2015 7:33 AM  Angel Palmer  has presented today for surgery, with the diagnosis of Left Breast Cancer  The various methods of treatment have been discussed with the patient and family. After consideration of risks, benefits and other options for treatment, the patient has consented to  Procedure(s): LEFT BREAST LUMPECTOMY WITH RADIOACTIVE SEED AND SENTINEL LYMPH NODE BIOPSY (Left) as a surgical intervention .  The patient's history has been reviewed, patient examined, no change in status, stable for surgery.  I have reviewed the patient's chart and labs.  Questions were answered to the patient's satisfaction.     [name] A

## 2015-08-04 NOTE — Transfer of Care (Signed)
Immediate Anesthesia Transfer of Care Note  Patient: Angel Palmer  Procedure(s) Performed: Procedure(s): LEFT BREAST LUMPECTOMY WITH RADIOACTIVE SEED AND SENTINEL LYMPH NODE BIOPSY (Left)  Patient Location: PACU  Anesthesia Type:GA combined with regional for post-op pain  Level of Consciousness: awake and patient cooperative  Airway & Oxygen Therapy: Patient Spontanous Breathing and Patient connected to face mask oxygen  Post-op Assessment: Report given to RN and Post -op Vital signs reviewed and stable  Post vital signs: Reviewed and stable  Last Vitals:  Filed Vitals:   08/04/15 0930  BP: 153/69  Pulse: 80  Temp: 36.6 C  Resp: 16    Complications: No apparent anesthesia complications

## 2015-08-04 NOTE — Discharge Instructions (Signed)
[facility] °Office Phone Number [phone] ° °BREAST BIOPSY/ PARTIAL MASTECTOMY: POST OP INSTRUCTIONS ° °Always review your discharge instruction sheet given to you by the facility where your surgery was performed. ° °IF YOU HAVE DISABILITY OR FAMILY LEAVE FORMS, YOU MUST BRING THEM TO THE OFFICE FOR PROCESSING.  DO NOT GIVE THEM TO YOUR DOCTOR. ° °1. A prescription for pain medication may be given to you upon discharge.  Take your pain medication as prescribed, if needed.  If narcotic pain medicine is not needed, then you may take acetaminophen (Tylenol) or ibuprofen (Advil) as needed. °2. Take your usually prescribed medications unless otherwise directed °3. If you need a refill on your pain medication, please contact your pharmacy.  They will contact our office to request authorization.  Prescriptions will not be filled after 5pm or on week-ends. °4. You should eat very light the first 24 hours after surgery, such as soup, crackers, pudding, etc.  Resume your normal diet the day after surgery. °5. Most patients will experience some swelling and bruising in the breast.  Ice packs and a good support bra will help.  Swelling and bruising can take several days to resolve.  °6. It is common to experience some constipation if taking pain medication after surgery.  Increasing fluid intake and taking a stool softener will usually help or prevent this problem from occurring.  A mild laxative (Milk of Magnesia or Miralax) should be taken according to package directions if there are no bowel movements after 48 hours. °7. Unless discharge instructions indicate otherwise, you may remove your bandages 24-48 hours after surgery, and you may shower at that time.  You may have steri-strips (small skin tapes) in place directly over the incision.  These strips should be left on the skin for 7-10 days.  If your surgeon used skin glue on the incision, you may shower in 24 hours.  The glue will flake off over the  next 2-3 weeks.  Any sutures or staples will be removed at the office during your follow-up visit. °8. ACTIVITIES:  You may resume regular daily activities (gradually increasing) beginning the next day.  Wearing a good support bra or sports bra minimizes pain and swelling.  You may have sexual intercourse when it is comfortable. °a. You may drive when you no longer are taking prescription pain medication, you can comfortably wear a seatbelt, and you can safely maneuver your car and apply brakes. °b. RETURN TO WORK:  ______________________________________________________________________________________ °9. You should see your doctor in the office for a follow-up appointment approximately two weeks after your surgery.  Your doctor’s nurse will typically make your follow-up appointment when she calls you with your pathology report.  Expect your pathology report 2-3 business days after your surgery.  You may call to check if you do not hear from us after three days. °10. OTHER INSTRUCTIONS: _______________________________________________________________________________________________ _____________________________________________________________________________________________________________________________________ °_____________________________________________________________________________________________________________________________________ °_____________________________________________________________________________________________________________________________________ ° °WHEN TO CALL YOUR DOCTOR: °1. Fever over 101.0 °2. Nausea and/or vomiting. °3. Extreme swelling or bruising. °4. Continued bleeding from incision. °5. Increased pain, redness, or drainage from the incision. ° °The clinic staff is available to answer your questions during regular business hours.  Please don’t hesitate to call and ask to speak to one of the nurses for clinical concerns.  If you have a medical emergency, go to the nearest  emergency room or call 911.  A surgeon from [facility] is always on call at the hospital. ° °For further questions, please visit [email or web address]  ° ° ° °  Post Anesthesia Home Care Instructions ° °Activity: °Get plenty of rest for the remainder of the day. A responsible adult should stay with you for 24 hours following the procedure.  °For the next 24 hours, DO NOT: °-Drive a car °-Operate machinery °-Drink alcoholic beverages °-Take any medication unless instructed by your physician °-Make any legal decisions or sign important papers. ° °Meals: °Start with liquid foods such as gelatin or soup. Progress to regular foods as tolerated. Avoid greasy, spicy, heavy foods. If nausea and/or vomiting occur, drink only clear liquids until the nausea and/or vomiting subsides. Call your physician if vomiting continues. ° °Special Instructions/Symptoms: °Your throat may feel dry or sore from the anesthesia or the breathing tube placed in your throat during surgery. If this causes discomfort, gargle with warm salt water. The discomfort should disappear within 24 hours. ° °If you had a scopolamine patch placed behind your ear for the management of post- operative nausea and/or vomiting: ° °1. The medication in the patch is effective for 72 hours, after which it should be removed.  Wrap patch in a tissue and discard in the trash. Wash hands thoroughly with soap and water. °2. You may remove the patch earlier than 72 hours if you experience unpleasant side effects which may include dry mouth, dizziness or visual disturbances. °3. Avoid touching the patch. Wash your hands with soap and water after contact with the patch. °  ° °

## 2015-08-04 NOTE — Anesthesia Preprocedure Evaluation (Signed)
Anesthesia Evaluation  Patient identified by MRN, date of birth, ID band Patient awake    Reviewed: Allergy & Precautions, NPO status , Patient's Chart, lab work & pertinent test results  Airway Mallampati: I  TM Distance: >3 FB Neck ROM: Full    Dental   Pulmonary    Pulmonary exam normal        Cardiovascular hypertension, Pt. on medications Normal cardiovascular exam     Neuro/Psych    GI/Hepatic   Endo/Other    Renal/GU      Musculoskeletal   Abdominal   Peds  Hematology   Anesthesia Other Findings   Reproductive/Obstetrics                             Anesthesia Physical Anesthesia Plan  ASA: II  Anesthesia Plan: General   Post-op Pain Management: MAC Combined w/ Regional for Post-op pain   Induction: Intravenous  Airway Management Planned: LMA  Additional Equipment:   Intra-op Plan:   Post-operative Plan: Extubation in OR  Informed Consent: I have reviewed the patients History and Physical, chart, labs and discussed the procedure including the risks, benefits and alternatives for the proposed anesthesia with the patient or authorized representative who has indicated his/her understanding and acceptance.     Plan Discussed with: CRNA and Surgeon  Anesthesia Plan Comments:         Anesthesia Quick Evaluation

## 2015-08-04 NOTE — Progress Notes (Signed)
Assisted Dr. Ossey with left, ultrasound guided, pectoralis block and nuc med tech # [id number] with nuc med inj. Side rails up, monitors on throughout procedure. See vital signs in flow sheet. Tolerated Procedure well. 

## 2015-08-04 NOTE — Op Note (Signed)
NAME:  [name], [name] NO.:  [id number]  MEDICAL RECORD NO.:  [id number]  LOCATION:  [facility]                          FACILITY:  [facility]  PHYSICIAN:  Coralie Keens, M.D. DATE OF BIRTH:  07-15-1941  DATE OF PROCEDURE:  08/04/2015 DATE OF DISCHARGE:                              OPERATIVE REPORT   PREOPERATIVE DIAGNOSIS:  Left breast cancer.  POSTOPERATIVE DIAGNOSIS:  Left breast cancer.  PROCEDURE: 1. Left breast radioactive seed localized lumpectomy. 2. Left axillary sentinel lymph node biopsy.  SURGEON:  Coralie Keens, M.D.  ANESTHESIA:  General with Marcaine and anesthesia applied pectoral block.  ESTIMATED BLOOD LOSS:  Minimal.  INDICATIONS:  This is a 74 year old female who was found to have a mass in her left breast on screening mammography.  Biopsy showed invasive cancer.  Decision was made to proceed with a radioactive seed, lumpectomy, and sentinel lymph node biopsy.  FINDINGS:  The radioactive seed and previous localization marker were found to be in the biopsy specimen.  I could easily palpate the mass and took further medial margin which was sent separately.  Also 2 sentinel lymph nodes were identified in the axilla and removed.  PROCEDURE IN DETAIL:  The patient was identified in the holding area as Angel Palmer.  The Neoprobe was brought in and the radioactive seed was confirmed to be in the breast.  Anesthesia performed a pectoral block and radioactive isotope was injected around the areola.  The patient was then taken to the operating room and placed in supine position on the operating room table and general anesthesia was induced. Her left breast and axilla were then prepped and draped in usual sterile fashion.  Using the Neoprobe, I identified the radioactive seed in the upper outer quadrant of the breast.  I made a longitudinal incision with a scalpel after anesthetizing with Marcaine.  I then performed a wide lumpectomy going down  to the chest wall circumferentially around the radioactive seed.  I could palpate the mass in the lumpectomy specimen. Once it was removed, I marked all margins with marker paint.  Specimen was x-rayed confirming that the radioactive seed and previous marker were in the specimen.  I could easily palpate the mass so I took further medial margin which was the closest margin to the mass.  This was sent separately to pathology.  I then placed surgical clips in the biopsy cavity.  I anesthetized further with Marcaine.  I then identified an area with the Neoprobe in the left axilla.  I anesthetized the skin with Marcaine.  I made an incision with a scalpel.  I then took the dissection down to the axillary tissue and identified 2 lymph nodes with uptake of radioactivity using Neoprobe.  These lymph nodes were removed and sent separately to pathology.  I then again examined the nodal basin and found no other radioactive uptake in the axilla or in the breast. At this point, hemostasis was achieved with cautery.  I anesthetized the wound further with Marcaine.  I then closed the subcutaneous tissue and both incisions with 3-0 Vicryl sutures and closed both skin incisions with 4-0 Monocryl.  Skin glue was then applied.  The patient tolerated the  procedure well.  All the counts were correct at the end of the procedure.  The patient was then extubated in the operating room and taken in stable condition to recovery room.     Coralie Keens, M.D.     DB/MEDQ  D:  08/04/2015  T:  08/04/2015  Job:  [id number]

## 2015-08-04 NOTE — Anesthesia Procedure Notes (Addendum)
Anesthesia Regional Block:  Pectoralis block  Pre-Anesthetic Checklist: ,, timeout performed, Correct Patient, Correct Site, Correct Laterality, Correct Procedure, Correct Position, site marked, Risks and benefits discussed,  Surgical consent,  Pre-op evaluation,  At surgeon's request and post-op pain management  Laterality: Left  Prep: chloraprep       Needles:  Injection technique: Single-shot     Needle Length: 9cm 9 cm Needle Gauge: 21 and 21 G    Additional Needles:  Procedures: ultrasound guided (picture in chart) Pectoralis block Narrative:  Start time: 08/04/2015 8:00 AM End time: 08/04/2015 8:10 AM Injection made incrementally with aspirations every 5 mL.  Performed by: Personally  Anesthesiologist: Lillia Abed  Additional Notes: Monitors applied. Patient sedated. Sterile prep and drape,hand hygiene and sterile gloves were used. Relevant anatomy identified.Needle position confirmed.Local anesthetic injected incrementally after negative aspiration. Local anesthetic spread visualized. Vascular puncture avoided. No complications. Image printed for medical record.The patient tolerated the procedure well.       Procedure Name: LMA Insertion Date/Time: 08/04/2015 8:34 AM Performed by: Toula Moos L Pre-anesthesia Checklist: Patient identified, Emergency Drugs available, Suction available, Patient being monitored and Timeout performed Patient Re-evaluated:Patient Re-evaluated prior to inductionOxygen Delivery Method: Circle System Utilized Preoxygenation: Pre-oxygenation with 100% oxygen Intubation Type: IV induction Ventilation: Mask ventilation without difficulty LMA: LMA inserted LMA Size: 4.0 Number of attempts: 1 Airway Equipment and Method: Bite block Placement Confirmation: positive ETCO2 Tube secured with: Tape Dental Injury: Teeth and Oropharynx as per pre-operative assessment

## 2015-08-04 NOTE — Op Note (Signed)
LEFT BREAST LUMPECTOMY WITH RADIOACTIVE SEED AND SENTINEL LYMPH NODE BIOPSY  Procedure Note  Angel Palmer 08/04/2015   Pre-op Diagnosis: Left Breast Cancer     Post-op Diagnosis: same  Procedure(s): LEFT BREAST LUMPECTOMY WITH RADIOACTIVE SEED AND SENTINEL LYMPH NODE BIOPSY  Surgeon(s): Coralie Keens, MD  Anesthesia: General  Staff:  Circulator: Izora Ribas, RN Scrub Person: Vara Guardian, RN  Estimated Blood Loss: Minimal               Specimens: sent to path (lumpectomy, second medial margin, nodes x 2)          Anica Alcaraz A   Date: 08/04/2015  Time: 9:24 AM

## 2015-08-05 ENCOUNTER — Encounter (HOSPITAL_BASED_OUTPATIENT_CLINIC_OR_DEPARTMENT_OTHER): Payer: Self-pay | Admitting: Surgery

## 2015-08-05 NOTE — Anesthesia Postprocedure Evaluation (Signed)
Anesthesia Post Note  Patient: Angel Palmer  Procedure(s) Performed: Procedure(s) (LRB): LEFT BREAST LUMPECTOMY WITH RADIOACTIVE SEED AND SENTINEL LYMPH NODE BIOPSY (Left)  Anesthesia type: general  Patient location: PACU  Post pain: Pain level controlled  Post assessment: Patient's Cardiovascular Status Stable  Last Vitals:  Filed Vitals:   08/04/15 1045  BP: 150/72  Pulse: 77  Temp: 36.7 C  Resp: 18    Post vital signs: Reviewed and stable  Level of consciousness: sedated  Complications: No apparent anesthesia complications

## 2015-08-09 ENCOUNTER — Encounter: Payer: Self-pay | Admitting: *Deleted

## 2015-08-09 ENCOUNTER — Other Ambulatory Visit: Payer: Self-pay | Admitting: Oncology

## 2015-08-09 NOTE — Progress Notes (Signed)
Ordered oncotype per Dr. Magrinat.  Faxed requisition to pathology and confirmed receipt.  

## 2015-08-11 ENCOUNTER — Telehealth: Payer: Self-pay | Admitting: *Deleted

## 2015-08-11 NOTE — Telephone Encounter (Signed)
Obtained requested records for pt.

## 2015-08-13 ENCOUNTER — Telehealth: Payer: Self-pay | Admitting: *Deleted

## 2015-08-13 NOTE — Telephone Encounter (Signed)
Spoke to patient about her appointment 11/3. Her oncotype test will not be resulted by then.  Confirmed new appointment for 09/01/15 at 1230pm.

## 2015-08-17 ENCOUNTER — Encounter (HOSPITAL_COMMUNITY): Payer: Self-pay

## 2015-08-17 ENCOUNTER — Other Ambulatory Visit: Payer: Self-pay | Admitting: Oncology

## 2015-08-19 ENCOUNTER — Ambulatory Visit: Payer: Medicare Other | Admitting: Oncology

## 2015-08-19 ENCOUNTER — Telehealth: Payer: Self-pay | Admitting: *Deleted

## 2015-08-19 NOTE — Telephone Encounter (Signed)
Called Angel Palmer inform of her Oncotype score of 3. Angel Palmer was seen by Dr. Sondra Come in [place] for xrt. Relate she is still having swelling under her arm that Dr. Ninfa Linden has been draining. Angel Palmer to see Dr. Ninfa Linden on 11/11 to assess swelling and possible draining.

## 2015-09-01 ENCOUNTER — Ambulatory Visit (HOSPITAL_BASED_OUTPATIENT_CLINIC_OR_DEPARTMENT_OTHER): Payer: Medicare Other | Admitting: Oncology

## 2015-09-01 ENCOUNTER — Telehealth: Payer: Self-pay | Admitting: Oncology

## 2015-09-01 VITALS — BP 148/43 | HR 88 | Temp 97.7°F | Resp 18 | Ht 65.0 in | Wt 125.0 lb

## 2015-09-01 DIAGNOSIS — C50912 Malignant neoplasm of unspecified site of left female breast: Secondary | ICD-10-CM

## 2015-09-01 DIAGNOSIS — C50412 Malignant neoplasm of upper-outer quadrant of left female breast: Secondary | ICD-10-CM

## 2015-09-01 DIAGNOSIS — Z170 Estrogen receptor positive status [ER+]: Secondary | ICD-10-CM | POA: Diagnosis not present

## 2015-09-01 DIAGNOSIS — C541 Malignant neoplasm of endometrium: Secondary | ICD-10-CM

## 2015-09-01 DIAGNOSIS — L54 Erythema in diseases classified elsewhere: Secondary | ICD-10-CM | POA: Diagnosis not present

## 2015-09-01 DIAGNOSIS — Z8541 Personal history of malignant neoplasm of cervix uteri: Secondary | ICD-10-CM

## 2015-09-01 MED ORDER — CIPROFLOXACIN HCL 500 MG PO TABS: 500.0000 mg | ORAL_TABLET | Freq: Two times a day (BID) | ORAL | Status: DC

## 2015-09-01 NOTE — Telephone Encounter (Signed)
Appointments made and avs printed for patient °

## 2015-09-01 NOTE — Progress Notes (Signed)
Angel Palmer  [phone] Fax:([phone]) [phone]     ID: Angel Palmer DOB: [birth year]  MR#: [id number]  JEH#:[id number]  Patient Care Team: Angel Dana, MD as PCP - General (Internal Medicine) Angel Cruel, MD as Consulting Physician (Oncology) PCP: Angel Dana, MD GYN: Angel Drown MD, SU: Angel Keens MD OTHER MD: Shari Prows PA-C  CHIEF COMPLAINT: Estrogen receptor positive breast cancer; history of endometrial cancer  CURRENT TREATMENT: Radiation therapy pending   BREAST CANCER HISTORY: From the original intake note:  Angel Palmer had a little bit of itching in her left breast area which led her to palpate a mass there. She brought this to her primary care physician's attention. Left diagnostic mammography with ultrasonography at [facility] diagnostic imaging 06/07/2015 showed an area of asymmetry in the left breast which was not seen in exams from June 2015 or May 2013. A second area of asymmetry in the inner aspect of the left breast was stable. The breast density was category C. Ultrasound of the left breast showed a hypoechoic irregular mass at the 1:00 position near the edge of the areola, measuring 1.4 cm maximally. There was a fatty replaced lymph node in the axilla measuring 2 cm.  Biopsy of this area was obtained 06/09/2015, and showed ([id number]) and invasive ductal carcinoma with neuroendocrine features, grade 2, with no evidence of lymphovascular invasion. Estrogen and progesterone were both positive in greater than 50% of the nuclei. HER-2 stain was negative. E-cadherin was strongly positive. He 67 was 20%. P53 was weakly reactive in approximately 2% of the nuclei. Immunostains for neuroendocrine features showed week focal positivity for signed up to 57 and CD 56. It was nonreactive for chromogranin and neuron specific enolase  The patient's subsequent history is as detailed below   HISTORY OF ENDOMETRIAL CANCER: From Dr  Joannie Springs 03/25/2015 summary note:  Angel Palmer underwent laparoscopically assisted vaginal hysterectomy, bilateral salpingo oophorectomy, and laparoscopic pelvic lymph node dissection in November, 1995. Final pathology revealed a moderately differentiated endometrioid adenocarcinoma. There was extensive involvement of the endometrial surface, but the maximal depth of invasion was less than 1 mm. Since she had Stage I-B, Grade 2, endometrial carcinoma with very minimal invasion and negative nodes, we elected not to treat her with any adjuvant therapy. She did well until January, 2000 when she developed right hip pain and was found to have a lytic lesion involving the right acetabulum. A CT scan showed 2 soft tissue masses in the pelvis, one of which was on the side wall involving the right hip, the second being more medially in the right peri-rectal space. Fine needle aspirate confirmed recurrence of endometrial cancer and she was treated with external pelvic radiation, which she completed in May, 2000. She had an excellent response to therapy with complete disappearance of the disease palpable on pelvic exam. Because of the bony involvement she has continued difficulties walking and uses a cane. In addition, she required intermittent narcotics for pain relief. She received adjuvant Megace therapy for 5 years.    INTERVAL HISTORY: Angel Palmer returns today for follow-up of her early stage estrogen receptor positive breast cancer accompanied by her daughter Angel Palmer. Since her last visit here she underwent left lumpectomy and sentinel lymph node sampling, 08/04/2015. The final pathology from this procedure ([id number]) found a 1.5 cm invasive ductal carcinoma, grade 2, with negative margins. Both sentinel lymph nodes were clear. Repeat prognostic panel found the tumor to be estrogen receptor 100% positive, progesterone  receptor 90% positive, both with strong staining intensity, with no HER-2  amplification, the signals ratio being 1.13 and the number per cell 2.15.--An Oncotype was obtained on this sample, with a recurrent score of 3 predicting a 4% risk of 10 year outside the breast recurrence if the patient's only systemic therapy is tamoxifen for 5 years. It also predicts no significant benefit from chemotherapy    REVIEW OF SYSTEMS: Angel Palmer did well from the surgery, with no unusual pain, bleeding, or fever. She did have a little bit of oozing from the wound initially, which has resolved. She has had however some swelling in the left chest wall area requiring drainage earlier this week. She thinks she is going to need drainage today as well. She describes herself is fatigued. She does have chronic low back pain, which is not more intense or persistent than before however. She has mild sinus problems but no significant cough. She has stress urinary incontinence. A detailed review of systems today was otherwise stable.  PAST MEDICAL HISTORY: Past Medical History  Diagnosis Date  . 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 Osteoarthritis   . Depression with anxiety   . Endometrial/uterine adenocarcinoma ([place])   . 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 Hypertension   . High cholesterol   . Neuromuscular disorder ([facility])     neuropathy in feet  . Chronic low back pain      PAST SURGICAL HISTORY: Past Surgical History  Procedure Laterality Date  . Total abdominal hysterectomy w/ bilateral salpingoophorectomy      1995  . Hand surgery Left   . Breast lumpectomy with radioactive seed and sentinel lymph node biopsy Left 08/04/2015    Procedure: LEFT BREAST LUMPECTOMY WITH RADIOACTIVE SEED AND SENTINEL LYMPH NODE BIOPSY;  Surgeon: Angel Keens, MD;  Location: [facility];  Service: General;  Laterality: Left;   FAMILY HISTORY Family History  Problem Relation Age of Onset  . Breast cancer Sister   . Ovarian cancer Sister   . Breast cancer Sister   .  Cancer Sister    the patient's father died from a stroke at age 24. The patient's mother died from colon cancer at age 55. The patient had one brother, 4 sisters. 2 of the patient's sisters had breast cancer, one had ovarian cancer diagnosed age 50 and the other one had endometrial cancer. Note that the patient's daughter has been tested for the BRCA mutation and is negative   GYNECOLOGIC HISTORY:  No LMP recorded. Patient has had a hysterectomy. Menarche age 1, first live birth age 28. The patient underwent TAH/BSO in 1995. She did not take hormone replacement. 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 She is widowed and lives by herself with a long haired she while. Her daughter him early Barrett lives in [place] and works as a Research scientist (physical sciences). She had noninvasive breast cancer diagnosed in 2014. The patient has 1 grandchild.    ADVANCED DIRECTIVES: The patient has a living will in place but not a healthcare part of attorney documents. At the initial clinic visit 09/01/2015 the patient was given the appropriate forms to complete and notarize at her discretion.  HEALTH MAINTENANCE: Social History  Substance Use Topics  . Smoking status: Never Smoker   . Smokeless  tobacco: Not on file  . Alcohol Use: No     Colonoscopy: 2014  PAP: Status post hysterectomy  Bone density: 2012  Lipid panel:  Allergies  Allergen Reactions  . Aspercreme Lidocaine [Lidocaine] Rash    After use several days   . Cephalosporins Rash    Other Reaction: Unknown to CEPHALEXIN  . Ciprofloxacin Rash  . Penicillins Rash    Other Reaction: Unknown to AMOXICILLIN    Current Outpatient Prescriptions  Medication Sig Dispense Refill  . amLODipine (NORVASC) 5 MG tablet Take 5 mg by mouth daily.    . Calcium-Vitamin D (CALTRATE 600 PLUS-VIT D  PO) Take 600 mg by mouth as needed.    . ciprofloxacin (CIPRO) 500 MG tablet Take 1 tablet (500 mg total) by mouth 2 (two) times daily. 10 tablet 0  . DULoxetine (CYMBALTA) 30 MG capsule Take 30 mg by mouth daily.    Marland Kitchen oxyCODONE-acetaminophen (PERCOCET) 5-325 MG tablet Take 1-2 tablets by mouth every 4 (four) hours as needed. 30 tablet 0  . pravastatin (PRAVACHOL) 10 MG tablet Take 10 mg by mouth daily.    . traMADol (ULTRAM) 50 MG tablet Take 50 mg by mouth 3 (three) times daily.    Marland Kitchen triamcinolone (NASACORT) 55 MCG/ACT AERO nasal inhaler Place 2 sprays into the nose as needed.     No current facility-administered medications for this visit.    OBJECTIVE: Older white woman who appears stated age 9 Vitals:   09/01/15 1305  BP: 148/43  Pulse: 88  Temp: 97.7 F (36.5 C)  Resp: 18     Body mass index is 20.8 kg/(m^2).    ECOG FS:1 - Symptomatic but completely ambulatory  Sclerae unicteric, EOMs intact Oropharynx clear, no thrush or other lesions No cervical or supraclavicular adenopathy Lungs no rales or rhonchi Heart regular rate and rhythm Abd soft, nontender, positive bowel sounds MSK no focal spinal tenderness, no upper extremity lymphedema Neuro: nonfocal, well oriented, anxious affect Breasts: The right breast is unremarkable per the left breast is status post recent lumpectomy. The incision has healed nicely but in the superior outer aspect of the breast/chest wall there is an obvious fluid collection containing I would guess approximately 50 mL of fluid. There is also some erythema below this area over an area of approximately 5 inches. There is no palpable edge   LAB RESULTS:  CMP     Component Value Date/Time   NA 143 07/05/2015 1530   K 3.9 07/05/2015 1530   CO2 30* 07/05/2015 1530   GLUCOSE 83 07/05/2015 1530   BUN 17.5 07/05/2015 1530   CREATININE 0.8 07/05/2015 1530   CALCIUM 9.2 07/05/2015 1530   PROT 6.4 07/05/2015 1530   ALBUMIN 3.6 07/05/2015 1530    AST 27 07/05/2015 1530   ALT 24 07/05/2015 1530   ALKPHOS 100 07/05/2015 1530   BILITOT 0.34 07/05/2015 1530    INo results found for: SPEP, UPEP  Lab Results  Component Value Date   WBC 6.8 07/05/2015   NEUTROABS 4.1 07/05/2015   HGB 11.1* 08/04/2015   HCT 34.8 07/05/2015   MCV 80.9 07/05/2015   PLT 195 07/05/2015      Chemistry      Component Value Date/Time   NA 143 07/05/2015 1530   K 3.9 07/05/2015 1530   CO2 30* 07/05/2015 1530   BUN 17.5 07/05/2015 1530   CREATININE 0.8 07/05/2015 1530      Component Value Date/Time   CALCIUM 9.2 07/05/2015 1530  ALKPHOS 100 07/05/2015 1530   AST 27 07/05/2015 1530   ALT 24 07/05/2015 1530   BILITOT 0.34 07/05/2015 1530       No results found for: LABCA2  No components found for: LABCA125  No results for input(s): INR in the last 168 hours.  Urinalysis No results found for: COLORURINE, APPEARANCEUR, LABSPEC, PHURINE, GLUCOSEU, HGBUR, BILIRUBINUR, KETONESUR, PROTEINUR, UROBILINOGEN, NITRITE, LEUKOCYTESUR  STUDIES: Nm Sentinel Node Inj-no Rpt (breast)  08/04/2015  CLINICAL DATA: left breast cancer Sulfur colloid was injected intradermally by the nuclear medicine technologist for breast cancer sentinel node localization.   Mm Breast Surgical Specimen  08/04/2015  CLINICAL DATA:  74 year old female -evaluate surgical specimen following left lumpectomy for breast cancer. EXAM: SPECIMEN RADIOGRAPH OF THE LEFT BREAST COMPARISON:  Previous exam(s). FINDINGS: Status post excision of the left breast. The radioactive seed and biopsy marker clip are present, completely intact, and were marked for pathology. IMPRESSION: Specimen radiograph of the left breast. Electronically Signed   By: Margarette Canada M.D.   On: 08/04/2015 09:25     ASSESSMENT: 74 y.o. Angel Palmer, [place] woman  (1) genetics testing pending  (2) status post TAH/BSO in 1995 for a stage IB, grade 2 endometrial cancer  (a) biopsy of lytic bone lesion in 2000 confirmed  metastatic recurrence  (b) status post pelvic irradiation completed May 2000  (c) on adjuvant Megace for 5 years, completed 2005  (3) status post left breast biopsy 06/09/2015, for a clinical T1c N1, stage II a invasive ductal carcinoma with neuroendocrine features, estrogen and progesterone receptor positive, HER-2 not amplified, with an MIB-1 of 20%  (4) left lumpectomy 08/04/2015 showed a pT1c pN0, stage IA invasive ductal carcinoma, grade 2, estrogen and progesterone receptor positive, HER-2 not amplified  (5) Oncotype score of 3 predicts a risk of recurrence outside the breast within 10 years of 4% if the patient's only systemic therapy is tamoxifen for 5 years. It also predicts no benefit from chemotherapy  (6) adjuvant radiation to follow --[facility]  (7) adjuvant estrogens to follow local treatment  PLAN: Dr. Ninfa Linden was kind enough to come over today (on his day off) to drain the patient's seroma. He obtained approximately 35 mL of serosanguineous fluid. The patient will follow up with him if there is further fluid accumulation.  I am starting her on Cipro 500 mg twice daily for 5 days because of the erythema developing in the left breast. Note that she is allergic to amoxicillin (rash and hives). She will let us know if the erythema does not clear over the next few days.  We reviewed today the results of her surgery, which are very favorable and also the Oncotype. In particular her prognosis according to the Oncotype score is sufficiently good that chemotherapy would not improve it. Accordingly no chemotherapy is planned.  The patient will meet with Dr. Sondra Come to discuss radiation. This is likely to start late this month and to continue through the better part of December (a total of 4 weeks are planned).  Accordingly she will return to see me in approximately 2 months. Before that visit she will have a DEXA scan. That will help [place] decide whether she will  be on tamoxifen or anastrozole at least to start with.  Jovonna has a good understanding of this plan. She agrees with it. She knows she has had an excellent chance of cure. She will call us with any problems that may develop before her next visit  here.  Angel Cruel, MD   09/01/2015 4:22 PM Medical Oncology and Hematology [facility] [address] [place], [place] [address] Tel. [phone]    Fax. [phone]

## 2015-09-23 ENCOUNTER — Telehealth: Payer: Self-pay | Admitting: *Deleted

## 2015-09-23 NOTE — Telephone Encounter (Signed)
Called pt to see if she has started xrt in [place] and needs. Pt relate she is still having her "breast drained" and has not started xrt as of yet. Pt is seeing Dr. Sondra Come on 12/12 to discuss start date of xrt.Pt has appt with Dr. Ninfa Linden on 12/9.

## 2015-10-13 ENCOUNTER — Encounter: Payer: Self-pay | Admitting: *Deleted

## 2015-10-20 ENCOUNTER — Ambulatory Visit (HOSPITAL_BASED_OUTPATIENT_CLINIC_OR_DEPARTMENT_OTHER): Payer: Medicare Other | Admitting: Oncology

## 2015-10-20 ENCOUNTER — Telehealth: Payer: Self-pay | Admitting: Oncology

## 2015-10-20 VITALS — BP 133/59 | HR 99 | Temp 97.6°F | Resp 18 | Ht 65.0 in | Wt 124.8 lb

## 2015-10-20 DIAGNOSIS — M859 Disorder of bone density and structure, unspecified: Secondary | ICD-10-CM

## 2015-10-20 DIAGNOSIS — Z8541 Personal history of malignant neoplasm of cervix uteri: Secondary | ICD-10-CM

## 2015-10-20 DIAGNOSIS — Z170 Estrogen receptor positive status [ER+]: Secondary | ICD-10-CM

## 2015-10-20 DIAGNOSIS — C50412 Malignant neoplasm of upper-outer quadrant of left female breast: Secondary | ICD-10-CM

## 2015-10-20 DIAGNOSIS — C50912 Malignant neoplasm of unspecified site of left female breast: Secondary | ICD-10-CM

## 2015-10-20 DIAGNOSIS — E559 Vitamin D deficiency, unspecified: Secondary | ICD-10-CM

## 2015-10-20 DIAGNOSIS — C541 Malignant neoplasm of endometrium: Secondary | ICD-10-CM

## 2015-10-20 NOTE — Telephone Encounter (Signed)
Appointments made and avs printed for patient °

## 2015-10-20 NOTE — Addendum Note (Signed)
Addended by: Laureen Abrahams on: 10/20/2015 04:49 PM   Modules accepted: Medications

## 2015-10-20 NOTE — Progress Notes (Signed)
[facility]  [phone] [phone]     ID: Angel Palmer DOB: 06-21-1941  MR#: [id number]  HYW#:[id number]  Patient Care Team: Angel Dana, MD as PCP - General (Internal Medicine) Angel Cruel, MD as Consulting Physician (Oncology) PCP: Angel Dana, MD GYN: Angel Drown MD, SU: Angel Keens MD OTHER MD: Angel Prows PA-C  CHIEF COMPLAINT: Estrogen receptor positive breast cancer; history of endometrial cancer  CURRENT TREATMENT: Radiation therapy    BREAST CANCER HISTORY: From the original intake note:  Angel Palmer had a little bit of itching in her left breast area which led her to palpate a mass there. She brought this to her primary care physician's attention. Left diagnostic mammography with ultrasonography at [facility] diagnostic [facility] 06/07/2015 showed an area of asymmetry in the left breast which was not seen in exams from June 2015 or May 2013. A second area of asymmetry in the inner aspect of the left breast was stable. The breast density was category C. Ultrasound of the left breast showed a hypoechoic irregular mass at the 1:00 position near the edge of the areola, measuring 1.4 cm maximally. There was a fatty replaced lymph node in the axilla measuring 2 cm.  Biopsy of this area was obtained 06/09/2015, and showed ([id number]) and invasive ductal carcinoma with neuroendocrine features, grade 2, with no evidence of lymphovascular invasion. Estrogen and progesterone were both positive in greater than 50% of the nuclei. HER-2 stain was negative. E-cadherin was strongly positive. He 67 was 20%. P53 was weakly reactive in approximately 2% of the nuclei. Immunostains for neuroendocrine features showed week focal positivity for signed up to 57 and CD 56. It was nonreactive for chromogranin and neuron specific enolase  The patient's subsequent history is as detailed below   HISTORY OF ENDOMETRIAL CANCER: From Dr Angel Palmer 03/25/2015 summary note:  Angel Palmer underwent laparoscopically assisted vaginal hysterectomy, bilateral salpingo oophorectomy, and laparoscopic pelvic lymph node dissection in November, 1995. Final pathology revealed a moderately differentiated endometrioid adenocarcinoma. There was extensive involvement of the endometrial surface, but the maximal depth of invasion was less than 1 mm. Since she had Stage I-B, Grade 2, endometrial carcinoma with very minimal invasion and negative nodes, we elected not to treat her with any adjuvant therapy. She did well until January, 2000 when she developed right hip pain and was found to have a lytic lesion involving the right acetabulum. A CT scan showed 2 soft tissue masses in the pelvis, one of which was on the side wall involving the right hip, the second being more medially in the right peri-rectal space. Fine needle aspirate confirmed recurrence of endometrial cancer and she was treated with external pelvic radiation, which she completed in May, 2000. She had an excellent response to therapy with complete disappearance of the disease palpable on pelvic exam. Because of the bony involvement she has continued difficulties walking and uses a cane. In addition, she required intermittent narcotics for pain relief. She received adjuvant Megace therapy for 5 years.    INTERVAL HISTORY: Angel Palmer returns today for follow-up of her left-sided breast cancer accompanied by her daughter Angel Palmer.Angel Palmer is still undergoing radiation, and will complete her treatments January 21. She is having some redness and some fatigue as the main side effects so far.  She had a bone density at [facility] in [place], but I do not yet have those results   REVIEW OF SYSTEMS: Angel Palmer continues to have problems from her left axillary  seroma. She had it drained a weekly and sometimes biweekly and now it appears to be stable, not causing her pain, with minimal discomfort in certain positions.  This is now being followed. She has chills but not hot flashes. She feels tired but it doesn't affect her daily activities. She has pain in her lower back which is not a new problem. Her urinary incontinence is a little bit worse she thinks dating back to a fall she had at home sometime ago. She has a runny nose and some other symptoms of mild allergies. She has rare headaches which are not new, or more persistent or intense than prior detailed review of systems today was otherwise stable.  PAST MEDICAL HISTORY: Past Medical History  Diagnosis Date  . 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 Osteoarthritis   . Depression with anxiety   . Endometrial/uterine adenocarcinoma ([place])   . 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 Hypertension   . High cholesterol   . Neuromuscular disorder (HCC)     neuropathy in feet  . Chronic low back pain      PAST SURGICAL HISTORY: Past Surgical History  Procedure Laterality Date  . Total abdominal hysterectomy w/ bilateral salpingoophorectomy      1995  . Hand surgery Left   . Breast lumpectomy with radioactive seed and sentinel lymph node biopsy Left 08/04/2015    Procedure: LEFT BREAST LUMPECTOMY WITH RADIOACTIVE SEED AND SENTINEL LYMPH NODE BIOPSY;  Surgeon: Angel Keens, MD;  Location: [facility];  Service: General;  Laterality: Left;   FAMILY HISTORY Family History  Problem Relation Age of Onset  . Breast cancer Sister   . Ovarian cancer Sister   . Breast cancer Sister   . Cancer Sister    the patient's father died from a stroke at age 82. The patient's mother died from colon cancer at age 62. The patient had one brother, 4 sisters. 2 of the patient's sisters had breast cancer, one had ovarian cancer diagnosed age 38 and the other one had endometrial cancer. Note that the patient's daughter has been tested for the BRCA mutation and is negative   GYNECOLOGIC HISTORY:  No LMP recorded. Patient has had a  hysterectomy. Menarche age 48, first live birth age 70. The patient underwent TAH/BSO in 1995. She did not take hormone replacement. 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 She is widowed and lives by herself with a long haired she while. Her daughter Angel Palmer lives in [place] and works as a Research scientist (physical sciences). She had noninvasive breast cancer diagnosed in 2014. The patient has 1 grandchild.    ADVANCED DIRECTIVES: the patient's advanced directives have been scanned. She has named her daughter Angel Palmer as her healthcare power of attorney. The patient has a living will in place.  HEALTH MAINTENANCE: Social History  Substance Use Topics  . Smoking status: Never Smoker   . Smokeless tobacco: Not on file  . Alcohol Use: No     Colonoscopy: 2014  PAP: Status post hysterectomy  Bone density: 2012  Lipid panel:  Allergies  Allergen Reactions  . Aspercreme Lidocaine [Lidocaine] Rash    After use several days   . Cephalosporins Rash    Other Reaction: Unknown to CEPHALEXIN  . Ciprofloxacin  Rash  . Penicillins Rash    Other Reaction: Unknown to AMOXICILLIN    Current Outpatient Prescriptions  Medication Sig Dispense Refill  . amLODipine (NORVASC) 5 MG tablet Take 5 mg by mouth daily.    . Calcium-Vitamin D (CALTRATE 600 PLUS-VIT D PO) Take 600 mg by mouth as needed.    . ciprofloxacin (CIPRO) 500 MG tablet Take 1 tablet (500 mg total) by mouth 2 (two) times daily. 10 tablet 0  . DULoxetine (CYMBALTA) 30 MG capsule Take 30 mg by mouth daily.    Marland Kitchen oxyCODONE-acetaminophen (PERCOCET) 5-325 MG tablet Take 1-2 tablets by mouth every 4 (four) hours as needed. 30 tablet 0  . pravastatin (PRAVACHOL) 10 MG tablet Take 10 mg by mouth daily.    . traMADol (ULTRAM) 50 MG tablet Take 50 mg by mouth 3 (three)  times daily.    Marland Kitchen triamcinolone (NASACORT) 55 MCG/ACT AERO nasal inhaler Place 2 sprays into the nose as needed.     No current facility-administered medications for this visit.    OBJECTIVE: Older white woman who appears stated age 75 Vitals:   10/20/15 0957  BP: 133/59  Pulse: 99  Temp: 97.6 F (36.4 C)  Resp: 18     Body mass index is 20.77 kg/(m^2).    ECOG FS:1 - Symptomatic but completely ambulatory  Sclerae unicteric, pupils round and equal Oropharynx clear and moist-- no thrush or other lesions No cervical or supraclavicular adenopathy Lungs no rales or rhonchi Heart regular rate and rhythm Abd soft, nontender, positive bowel sounds MSK no focal spinal tenderness, no upper extremity lymphedema Neuro: nonfocal, well oriented, appropriate affect Breasts: the right breast is unremarkable. The left breast is status post lumpectomy and currently undergoing radiation. There is mild erythema. There is no desquamation. In the left axilla there is a seroma measuring approximately 3 x 2 cm, not associated with erythema or tenderness.    LAB RESULTS:  CMP     Component Value Date/Time   NA 143 07/05/2015 1530   K 3.9 07/05/2015 1530   CO2 30* 07/05/2015 1530   GLUCOSE 83 07/05/2015 1530   BUN 17.5 07/05/2015 1530   CREATININE 0.8 07/05/2015 1530   CALCIUM 9.2 07/05/2015 1530   PROT 6.4 07/05/2015 1530   ALBUMIN 3.6 07/05/2015 1530   AST 27 07/05/2015 1530   ALT 24 07/05/2015 1530   ALKPHOS 100 07/05/2015 1530   BILITOT 0.34 07/05/2015 1530    INo results found for: SPEP, UPEP  Lab Results  Component Value Date   WBC 6.8 07/05/2015   NEUTROABS 4.1 07/05/2015   HGB 11.1* 08/04/2015   HCT 34.8 07/05/2015   MCV 80.9 07/05/2015   PLT 195 07/05/2015      Chemistry      Component Value Date/Time   NA 143 07/05/2015 1530   K 3.9 07/05/2015 1530   CO2 30* 07/05/2015 1530   BUN 17.5 07/05/2015 1530   CREATININE 0.8 07/05/2015 1530      Component Value  Date/Time   CALCIUM 9.2 07/05/2015 1530   ALKPHOS 100 07/05/2015 1530   AST 27 07/05/2015 1530   ALT 24 07/05/2015 1530   BILITOT 0.34 07/05/2015 1530       No results found for: LABCA2  No components found for: LABCA125  No results for input(s): INR in the last 168 hours.  Urinalysis No results found for: COLORURINE, APPEARANCEUR, LABSPEC, PHURINE, GLUCOSEU, HGBUR, BILIRUBINUR, KETONESUR, PROTEINUR, UROBILINOGEN, NITRITE, LEUKOCYTESUR  STUDIES: No results found.  ASSESSMENT: 75 y.o. [place], [place] woman  (1) genetics testing pending  (2) status post TAH/BSO in 1995 for a stage IB, grade 2 endometrial cancer  (a) biopsy of lytic bone lesion in 2000 confirmed metastatic recurrence  (b) status post pelvic irradiation completed May 2000  (c) on adjuvant Megace for 5 years, completed 2005  (3) status post left breast biopsy 06/09/2015, for a clinical T1c N1, stage II a invasive ductal carcinoma with neuroendocrine features, estrogen and progesterone receptor positive, HER-2 not amplified, with an MIB-1 of 20%  (4) left lumpectomy 08/04/2015 showed a pT1c pN0, stage IA invasive ductal carcinoma, grade 2, estrogen and progesterone receptor positive, HER-2 not amplified  (5) Oncotype score of 3 predicts a risk of recurrence outside the breast within 10 years of 4% if the patient's only systemic therapy is tamoxifen for 5 years. It also predicts no benefit from chemotherapy  (6) adjuvant radiation done in [place] --to be completed 11/05/2014  (7) to start tamoxifen in favor 14 2017  (8) osteopenia, with a T score of -2.4 on bone density scan 09/16/2015  PLAN: Arwa is tolerating the radiation treatments moderately well. Of course the hardest part is a head of her. I think she will have recovered sufficiency by mid-February that she can start anti-estrogens at that time.  We obtained a copy of her bone density from [facility] in [place], and it shows significant  osteopenia, close to osteoporosis. For that reason and because she is status post hysterectomy, I think she would be a good candidate for tamoxifen.  Today we discussed the possible toxicities, side effects and complications of that agent. She is going to started therapy 14 2017. She will see me in late April and if she is tolerating it well at that point we will initiate long-term follow-up.  She has an appointment with Dr. Ninfa [place] for this Friday but she would like to move it to February because she can't have any procedures done while undergoing radiation to that area. I will let Dr. Ninfa [place] know.  Marlen brought [place] a copy of her advanced directives. These will be scanned in Epic. She has named her daughter as her healthcare power of attorney  With standard of care as she is receiving she has an excellent chance of cure. sShe knows to call for any problems that may develop before her next visit here.  Angel Cruel, MD   10/20/2015 10:24 AM Medical Oncology and Hematology [facility] [address] [place], [place] [address] Tel. [phone]    Fax. [phone]

## 2015-11-05 ENCOUNTER — Telehealth: Payer: Self-pay | Admitting: *Deleted

## 2015-11-05 ENCOUNTER — Telehealth: Payer: Self-pay | Admitting: Oncology

## 2015-11-05 DIAGNOSIS — C50412 Malignant neoplasm of upper-outer quadrant of left female breast: Secondary | ICD-10-CM

## 2015-11-05 NOTE — Telephone Encounter (Signed)
Spoke with patient about survivorship appointment in March

## 2015-11-05 NOTE — Telephone Encounter (Signed)
Called pt to congratulate on completion of xrt.  Discussed survivorship program and referral.  Denies needs or questions at this time.

## 2016-01-11 ENCOUNTER — Ambulatory Visit (HOSPITAL_BASED_OUTPATIENT_CLINIC_OR_DEPARTMENT_OTHER): Payer: Medicare Other | Admitting: Nurse Practitioner

## 2016-01-11 ENCOUNTER — Encounter: Payer: Self-pay | Admitting: Nurse Practitioner

## 2016-01-11 VITALS — BP 147/64 | HR 94 | Temp 97.6°F | Resp 18 | Ht 65.0 in | Wt 122.8 lb

## 2016-01-11 DIAGNOSIS — C7951 Secondary malignant neoplasm of bone: Secondary | ICD-10-CM

## 2016-01-11 DIAGNOSIS — C50412 Malignant neoplasm of upper-outer quadrant of left female breast: Secondary | ICD-10-CM | POA: Diagnosis present

## 2016-01-11 DIAGNOSIS — Z170 Estrogen receptor positive status [ER+]: Secondary | ICD-10-CM | POA: Diagnosis not present

## 2016-01-11 NOTE — Progress Notes (Signed)
CLINIC:  Cancer Survivorship   REASON FOR VISIT:  Routine follow-up post-treatment for a recent history of breast cancer.  BRIEF ONCOLOGIC HISTORY:    Breast cancer of upper-outer quadrant of left female breast ([place])   1995 Cancer Diagnosis Stage IB, grade 2 endometrial cancer, status post TAH/BSO in 1995  (a) biopsy of lytic bone lesion in 2000 confirmed metastatic recurrence (b) status post pelvic irradiation completed May 2000 (c) on adjuvant Meg   06/07/2015 Mammogram Left breast asymmetry   06/09/2015 Initial Biopsy Left breast core needle bx: invasive ductal carcinoma with neuroendocrine features, ER+, PR+, Her2/neu negative, ki67 20%   06/09/2015 Clinical Stage Stage IA: T1c N0   07/08/2015 Breast MRI Enhancing round mass with irregular and spiculated margins in the upper, slightly outer left breast at anterior depth measuring approximately 1.2 x 1.6 x 1.6 cm   08/04/2015 Definitive Surgery Left lumpectomy: invasive ductal carcinoma, grade 2, ER+ (100%), PR+ (90%), HER2/neu negative (ratio 1.13), 2 LN removed and negative.   08/04/2015 Pathologic Stage Stage IA: pT1c pN0   08/04/2015 Oncotype testing RS 3 (4% ROR)   10/06/2015 - 11/08/2015 Radiation Therapy Adjuvant RT: Left breast 42.72 Gy over 16 fractions; boost 10 Gy over 5 fractions.   12/2015 -  Anti-estrogen oral therapy Tamoxifen 20 mg daily    Endometrial cancer [facility])    INTERVAL HISTORY:  Ms. Base presents to the [facility] today for our initial meeting to review her survivorship care plan detailing her treatment course for breast cancer, as well as monitoring long-term side effects of that treatment, education regarding health maintenance, screening, and overall wellness and health promotion.     Overall, Ms. Gambrel reports feeling quite well since completing her radiation therapy approximately two months ago.  She continues with fatigue but reports the skin changes  overlying her left breast have improved.  She does nap during the day. She has some intermittent shooting pain along her left breast but no mass or nodule. She denies headache, cough, shortness of breath or bone pain.  She has decreased appetite and some taste changes.  She denies weight loss.  She is active, but not as active as she'd like to be.  She is tolerating the tamoxifen well denying hot flashes, chest pain or calf pain / swelling.  She did experience breast cellulitits during radiation, which required multiple rounds of antibiotics.  This has resolved.  She saw her PCP last week and was found to have an UTI and "low blood counts."  She was placed on antibiotics for the UTI (which she is asymptomatic from) with plans to recheck her urinalysis in a few weeks.  Her PCP asked her to discuss her low blood counts with Dr. Jana Hakim next month.  Of note, she has a copy of these results at home but forgot it today.   REVIEW OF SYSTEMS:  General: Fatigue as above. Denies fever, chills, unintentional weight loss, or night sweats. HEENT: Denies visual changes, hearing loss, mouth sores or difficulty swallowing. Cardiac: Denies palpitations, chest pain, and lower extremity edema.  Respiratory: Denies wheeze or dyspnea on exertion.  Breast: As above. Denies any new nodularity, masses, nipple changes, or nipple discharge.  GI: Appetite / taste changes as above. Denies abdominal pain, constipation, diarrhea, nausea, or vomiting.  GU: Recent UTI found on urinalysis, as above. Denies dysuria, hematuria, vaginal bleeding, vaginal discharge, or vaginal dryness.  Musculoskeletal: Denies joint or bone pain.  Neuro: Denies recent fall or numbness / tingling in  her extremities. Skin: Denies rash, pruritis, or open wounds.  Psych: Denies depression, anxiety, insomnia, or memory loss.   A 14-point review of systems was completed and was negative, except as noted above.   ONCOLOGY TREATMENT TEAM:  1. Surgeon:  Dr.  Ninfa Linden at [facility]  2. Medical Oncologist: Dr. Jana Hakim 3. Radiation Oncologist: Dr. Sondra Come    PAST MEDICAL/SURGICAL HISTORY:  Past Medical History  Diagnosis Date  . 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 Osteoarthritis   . Depression with anxiety   . Endometrial/uterine adenocarcinoma ([place])   . 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 Hypertension   . High cholesterol   . Neuromuscular disorder ([facility])     neuropathy in feet  . Chronic low back pain    Past Surgical History  Procedure Laterality Date  . Total abdominal hysterectomy w/ bilateral salpingoophorectomy      1995  . Hand surgery Left   . Breast lumpectomy with radioactive seed and sentinel lymph node biopsy Left 08/04/2015    Procedure: LEFT BREAST LUMPECTOMY WITH RADIOACTIVE SEED AND SENTINEL LYMPH NODE BIOPSY;  Surgeon: Coralie Keens, MD;  Location: [facility];  Service: General;  Laterality: Left;     ALLERGIES:  Allergies  Allergen Reactions  . Aspercreme Lidocaine [Lidocaine] Rash    After use several days   . Cephalosporins Rash    Other Reaction: Unknown to CEPHALEXIN  . Ciprofloxacin Rash  . Penicillins Rash    Other Reaction: Unknown to AMOXICILLIN     CURRENT MEDICATIONS:  Current Outpatient Prescriptions on File Prior to Visit  Medication Sig Dispense Refill  . amLODipine (NORVASC) 5 MG tablet Take 5 mg by mouth daily.    . Calcium-Vitamin D (CALTRATE 600 PLUS-VIT D PO) Take 600 mg by mouth as needed.    . DULoxetine (CYMBALTA) 30 MG capsule Take 30 mg by mouth daily.    . traMADol (ULTRAM) 50 MG tablet Take 50 mg by mouth 3 (three) times daily. Reported on 01/11/2016    . pravastatin (PRAVACHOL) 10 MG tablet Take 10 mg by mouth daily. Reported on 01/11/2016    . triamcinolone (NASACORT) 55 MCG/ACT AERO nasal inhaler Place 2 sprays into the nose as needed. Reported on 01/11/2016     No current facility-administered medications on file prior to visit.      ONCOLOGIC FAMILY HISTORY:  Family History  Problem Relation Age of Onset  . Breast cancer Sister   . Ovarian cancer Sister   . Breast cancer Sister   . Cancer Sister      GENETIC COUNSELING/TESTING: No    SOCIAL HISTORY:  PAISLY FINGERHUT is widowed and lives in [place], [place]  She has one daughter and one grandchild. 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  She denies any current or history of tobacco, alcohol, or illicit drug use.     PHYSICAL EXAMINATION:  Vital Signs: Filed Vitals:   01/11/16 0940  BP: 147/64  Pulse: 94  Temp: 97.6 F (36.4 C)  Resp: 18   Weight: 122.8 ECOG Performance Status: 0  General: Well-nourished, well-appearing female in no acute distress.  She is unaccompanied in clinic today.   HEENT: Head is atraumatic and normocephalic.  Pupils equal and reactive to light and accomodation. Conjunctivae clear without exudate.  Sclerae anicteric. Oral mucosa is pink, moist, and intact without lesions.  Oropharynx is pink without lesions or erythema.  Lymph: No  cervical, supraclavicular, infraclavicular, or axillary lymphadenopathy noted on palpation.  Cardiovascular: Regular rate and rhythm without murmurs, rubs, or gallops. Respiratory: Clear to auscultation bilaterally. Chest expansion symmetric without accessory muscle use on inspiration or expiration.  GI: Abdomen soft and round. No tenderness to palpation. Bowel sounds normoactive in 4 quadrants.  GU: Deferred.   Neuro: No focal deficits. Steady gait.  Psych: Mood and affect normal and appropriate for situation.  Extremities: No edema, cyanosis, or clubbing.  Skin: Warm and dry. No open lesions noted.   LABORATORY DATA:  None for this visit.  DIAGNOSTIC IMAGING:  None for this visit.     ASSESSMENT AND PLAN:   1. 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  Ms. Vanburen is doing well without clinical symptoms worrisome for disease recurrence. She will follow-up with her medical oncologist,  Dr. Jana Hakim, in April 2017 with history and physical examination per surveillance protocol.  She will continue her anti-estrogen therapy with tamoxifen as prescribed by Dr. Jana Hakim at this time. She was instructed to make Dr. Jana Hakim or myself aware if she begins to experience any new or increased side effects of the medication and I could see her back in clinic to help manage those side effects, as needed. Side effects of Tamoxifen were again reviewed with her as well.  A comprehensive survivorship care plan and treatment summary was reviewed with the patient today detailing her breast cancer diagnosis, treatment course, potential late/long-term effects of treatment, appropriate follow-up care with recommendations for the future, and patient education resources.  A copy of this summary, along with a letter will be sent to the patient's primary care provider via in basket message after today's visit.  Ms. Davidson is welcome to return to the Survivorship Clinic in the future, as needed; no follow-up will be scheduled at this time.    2. Low blood counts: Unfortunately, we do not have a copy of Ms. Weyer's blood work today.  It may be that she had a bit of a viral infection that suppressed her counts slightly.  I have asked her to bring her printout of her lab results to her visit to see Dr. Jana Hakim in the next few weeks so that we can evaluate them more fully.  She did not have chemotherapy and the tamoxifen should not suppress her blood counts.  She is without symptoms of infection today, and I have asked her continue her antibiotics as prescribed by Eulogio Ditch NP to treat her UTI.  I have also asked her to notify us or Ms. Jones if she develops symptoms consistent with  infection.  3. Cancer screening:  Due to Ms. Nelson's history and her age, she should receive screening for skin cancers and colon cancer. She is S/P hysterectomy. The information and recommendations are listed on the patient's comprehensive care plan/treatment summary and were reviewed in detail with the patient.    4. Health maintenance and wellness promotion: Ms. Koestner was encouraged to consume 5-7 servings of fruits and vegetables per day. We reviewed the "Nutrition Rainbow" handout, as well as discussed recommendations to maximize nutrition and minimize recurrence, such as increased intake of fruits, vegetables, lean proteins, and minimizing the intake of red meats and processed foods.  She was also encouraged to engage in moderate to vigorous exercise for 30 minutes per day most days of  the week. We discussed the [employer] fitness program, which is designed for cancer survivors to help them become more physically fit after cancer treatments.  She was instructed to limit her alcohol consumption and continue to abstain from tobacco use.  A copy of the "Take Control of Your Health" brochure was given to her reinforcing these recommendations.   5. Support services/counseling: It is not uncommon for this period of the patient's cancer care trajectory to be one of many emotions and stressors.  We discussed an opportunity for her to participate in the next session of [facility] ("Finding Your New Normal") support group series designed for patients after they have completed treatment.  Ms. Schexnayder was encouraged to take advantage of our many other support services programs, support groups, and/or counseling in coping with her new life as a cancer survivor after completing anti-cancer treatment.  She was offered support today through active listening and expressive supportive counseling.  She was given information regarding our available services and encouraged to contact me with any questions or for  help enrolling in any of our support group/programs.    A total of 50 minutes of face-to-face time was spent with this patient with greater than 50% of that time in counseling and care-coordination.   Sylvan Cheese, NP  Survivorship Program [facility] [phone]   Note: PRIMARY CARE PROVIDER Lavonia Dana, [place] [phone]

## 2016-02-09 ENCOUNTER — Other Ambulatory Visit (HOSPITAL_BASED_OUTPATIENT_CLINIC_OR_DEPARTMENT_OTHER): Payer: Medicare Other

## 2016-02-09 ENCOUNTER — Other Ambulatory Visit: Payer: Self-pay | Admitting: *Deleted

## 2016-02-09 ENCOUNTER — Ambulatory Visit (HOSPITAL_BASED_OUTPATIENT_CLINIC_OR_DEPARTMENT_OTHER): Payer: Medicare Other | Admitting: Oncology

## 2016-02-09 ENCOUNTER — Telehealth: Payer: Self-pay | Admitting: Oncology

## 2016-02-09 VITALS — BP 140/68 | HR 87 | Temp 98.0°F | Resp 18 | Ht 65.0 in | Wt 121.6 lb

## 2016-02-09 DIAGNOSIS — M545 Low back pain: Secondary | ICD-10-CM

## 2016-02-09 DIAGNOSIS — C50412 Malignant neoplasm of upper-outer quadrant of left female breast: Secondary | ICD-10-CM

## 2016-02-09 DIAGNOSIS — Z79811 Long term (current) use of aromatase inhibitors: Secondary | ICD-10-CM | POA: Diagnosis not present

## 2016-02-09 DIAGNOSIS — C50912 Malignant neoplasm of unspecified site of left female breast: Secondary | ICD-10-CM

## 2016-02-09 DIAGNOSIS — M810 Age-related osteoporosis without current pathological fracture: Secondary | ICD-10-CM

## 2016-02-09 DIAGNOSIS — Z8541 Personal history of malignant neoplasm of cervix uteri: Secondary | ICD-10-CM

## 2016-02-09 DIAGNOSIS — C541 Malignant neoplasm of endometrium: Secondary | ICD-10-CM

## 2016-02-09 LAB — CBC WITH DIFFERENTIAL/PLATELET
BASO%: 0.8 % (ref 0.0–2.0)
BASOS ABS: 0 10*3/uL (ref 0.0–0.1)
EOS ABS: 0.1 10*3/uL (ref 0.0–0.5)
EOS%: 2.6 % (ref 0.0–7.0)
HCT: 35.4 % (ref 34.8–46.6)
HEMOGLOBIN: 10.7 g/dL — AB (ref 11.6–15.9)
LYMPH%: 18.9 % (ref 14.0–49.7)
MCH: 23.3 pg — AB (ref 25.1–34.0)
MCHC: 30.3 g/dL — ABNORMAL LOW (ref 31.5–36.0)
MCV: 76.7 fL — AB (ref 79.5–101.0)
MONO#: 0.5 10*3/uL (ref 0.1–0.9)
MONO%: 9.5 % (ref 0.0–14.0)
NEUT%: 68.2 % (ref 38.4–76.8)
NEUTROS ABS: 3.5 10*3/uL (ref 1.5–6.5)
PLATELETS: 182 10*3/uL (ref 145–400)
RBC: 4.61 10*6/uL (ref 3.70–5.45)
RDW: 17.4 % — AB (ref 11.2–14.5)
WBC: 5.1 10*3/uL (ref 3.9–10.3)
lymph#: 1 10*3/uL (ref 0.9–3.3)

## 2016-02-09 LAB — COMPREHENSIVE METABOLIC PANEL
ALBUMIN: 3.8 g/dL (ref 3.5–5.0)
ALK PHOS: 74 U/L (ref 40–150)
ALT: 18 U/L (ref 0–55)
ANION GAP: 8 meq/L (ref 3–11)
AST: 20 U/L (ref 5–34)
BILIRUBIN TOTAL: 0.39 mg/dL (ref 0.20–1.20)
BUN: 20.5 mg/dL (ref 7.0–26.0)
CALCIUM: 9.2 mg/dL (ref 8.4–10.4)
CO2: 28 mEq/L (ref 22–29)
Chloride: 106 mEq/L (ref 98–109)
Creatinine: 0.9 mg/dL (ref 0.6–1.1)
EGFR: 64 mL/min/{1.73_m2} — AB (ref >90)
Glucose: 87 mg/dl (ref 70–140)
POTASSIUM: 3.9 meq/L (ref 3.5–5.1)
SODIUM: 142 meq/L (ref 136–145)
TOTAL PROTEIN: 6.9 g/dL (ref 6.4–8.3)

## 2016-02-09 MED ORDER — TAMOXIFEN CITRATE 20 MG PO TABS: 20.0000 mg | ORAL_TABLET | Freq: Every day | ORAL | Status: DC

## 2016-02-09 NOTE — Telephone Encounter (Signed)
appt made and avs printed °

## 2016-02-09 NOTE — Progress Notes (Signed)
Angel Palmer  [phone] [phone]     ID: NAVA SONG DOB: 09/27/1941  MR#: [id number]  WOE#:[id number]  Patient Care Team: Lavonia Dana, MD as PCP - General (Internal Medicine) Chauncey Cruel, MD as Consulting Physician (Oncology) Gery Pray, MD as Consulting Physician (Radiation Oncology) Coralie Keens, MD as Consulting Physician (General Surgery) Sylvan Cheese, NP as Nurse Practitioner (Hematology and Oncology) PCP: Lavonia Dana, MD GYN: Mellody Drown MD, SU: Coralie Keens MD OTHER MD: Shari Prows PA-C  CHIEF COMPLAINT: Estrogen receptor positive breast cancer; history of endometrial cancer  CURRENT TREATMENT: Tamoxifen   BREAST CANCER HISTORY: From the original intake note:  [name] had a little bit of itching in her left breast area which led her to palpate a mass there. She brought this to her primary care physician's attention. Left diagnostic mammography with ultrasonography at [facility] diagnostic imaging 06/07/2015 showed an area of asymmetry in the left breast which was not seen in exams from June 2015 or May 2013. A second area of asymmetry in the inner aspect of the left breast was stable. The breast density was category C. Ultrasound of the left breast showed a hypoechoic irregular mass at the 1:00 position near the edge of the areola, measuring 1.4 cm maximally. There was a fatty replaced lymph node in the axilla measuring 2 cm.  Biopsy of this area was obtained 06/09/2015, and showed ([id number]) and invasive ductal carcinoma with neuroendocrine features, grade 2, with no evidence of lymphovascular invasion. Estrogen and progesterone were both positive in greater than 50% of the nuclei. HER-2 stain was negative. E-cadherin was strongly positive. He 77 was 20%. P53 was weakly reactive in approximately 2% of the nuclei. Immunostains for neuroendocrine features showed week focal positivity for signed  up to 57 and CD 56. It was nonreactive for chromogranin and neuron specific enolase  The patient's subsequent history is as detailed below   HISTORY OF ENDOMETRIAL CANCER: From Dr Joannie Springs 03/25/2015 summary note:  Angel Palmer underwent laparoscopically assisted vaginal hysterectomy, bilateral salpingo oophorectomy, and laparoscopic pelvic lymph node dissection in November, 1995. Final pathology revealed a moderately differentiated endometrioid adenocarcinoma. There was extensive involvement of the endometrial surface, but the maximal depth of invasion was less than 1 mm. Since she had Stage I-B, Grade 2, endometrial carcinoma with very minimal invasion and negative nodes, we elected not to treat her with any adjuvant therapy. She did well until January, 2000 when she developed right hip pain and was found to have a lytic lesion involving the right acetabulum. A CT scan showed 2 soft tissue masses in the pelvis, one of which was on the side wall involving the right hip, the second being more medially in the right peri-rectal space. Fine needle aspirate confirmed recurrence of endometrial cancer and she was treated with external pelvic radiation, which she completed in May, 2000. She had an excellent response to therapy with complete disappearance of the disease palpable on pelvic exam. Because of the bony involvement she has continued difficulties walking and uses a cane. In addition, she required intermittent narcotics for pain relief. She received adjuvant Megace therapy for 5 years.    INTERVAL HISTORY: [name] returns today for follow-up of her estrogen receptor positive breast cancer accompanied by her daughter Joelene Millin.since her last visit here Kyliah completed her radiation treatments and then on 11/17/2015 started tamoxifen. She is tolerating that remarkably well. She is having a mild vaginal wetness, but it is not  any worse than before. She was already wearing a pad prior to this. She's  had no hot flashes. She initially paid $18 a month but has switched pharmacy and now staying $13 a month for the tamoxifen   REVIEW OF SYSTEMS: Ouida has a little soreness in her left axilla, and then of course she has pain in her lower back which is chronic. This is not more intense or persistent than before. She has chills at times. She feels tired. She has leg cramps at times. She keeps a dry cough. She is short of breath when climbing stairs. She has significant urinary stress incontinence. She still is never quite recovered the energy she had before, but she is doing yard work and all her housework. She does not otherwise exercise regularly. A detailed review of systems today was noncontributory except as noted  PAST MEDICAL HISTORY: Past Medical History  Diagnosis Date  . 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 Osteoarthritis   . Depression with anxiety   . Endometrial/uterine adenocarcinoma ([facility])   . 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 Hypertension   . High cholesterol   . Neuromuscular disorder ([facility])     neuropathy in feet  . Chronic low back pain      PAST SURGICAL HISTORY: Past Surgical History  Procedure Laterality Date  . Total abdominal hysterectomy w/ bilateral salpingoophorectomy      1995  . Hand surgery Left   . Breast lumpectomy with radioactive seed and sentinel lymph node biopsy Left 08/04/2015    Procedure: LEFT BREAST LUMPECTOMY WITH RADIOACTIVE SEED AND SENTINEL LYMPH NODE BIOPSY;  Surgeon: Coralie Keens, MD;  Location: [facility];  Service: General;  Laterality: Left;   FAMILY HISTORY Family History  Problem Relation Age of Onset  . Breast cancer Sister   . Ovarian cancer Sister   . Breast cancer Sister   . Cancer Sister    the patient's father died from a stroke at age 84. The patient's mother died from colon cancer at age 34. The patient had one brother, 4 sisters. 2 of the patient's sisters had breast cancer, one had  ovarian cancer diagnosed age 33 and the other one had endometrial cancer. Note that the patient's daughter has been tested for the BRCA mutation and is negative   GYNECOLOGIC HISTORY:  No LMP recorded. Patient has had a hysterectomy. Menarche age 70, first live birth age 78. The patient underwent TAH/BSO in 1995. She did not take hormone replacement. 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 She is widowed and lives by herself with a long haired she while. Her daughter him early Barrett lives in [place] and works as a Research scientist (physical sciences). She had noninvasive breast cancer diagnosed in 2014. The patient has 1 grandchild.    ADVANCED DIRECTIVES: the patient's advanced directives have been scanned. She has named her daughter Monico Blitz Barrett as her healthcare power of attorney. The patient has a living will in place.  HEALTH MAINTENANCE: Social History  Substance Use Topics  . Smoking status: Never Smoker   . Smokeless tobacco: Not on file  . Alcohol Use: No     Colonoscopy: 2014  PAP: Status post hysterectomy  Bone density: 2012  Lipid panel:  Allergies  Allergen Reactions  .  Aspercreme Lidocaine [Lidocaine] Rash    After use several days   . Cephalosporins Rash    Other Reaction: Unknown to CEPHALEXIN  . Ciprofloxacin Rash  . Penicillins Rash    Other Reaction: Unknown to AMOXICILLIN    Current Outpatient Prescriptions  Medication Sig Dispense Refill  . amLODipine (NORVASC) 5 MG tablet Take 5 mg by mouth daily.    . Calcium-Vitamin D (CALTRATE 600 PLUS-VIT D PO) Take 600 mg by mouth as needed.    . DULoxetine (CYMBALTA) 30 MG capsule Take 30 mg by mouth daily.    . pravastatin (PRAVACHOL) 10 MG tablet Take 10 mg by mouth daily. Reported on 01/11/2016    . traMADol (ULTRAM) 50 MG tablet Take 50 mg by mouth 3  (three) times daily. Reported on 01/11/2016    . triamcinolone (NASACORT) 55 MCG/ACT AERO nasal inhaler Place 2 sprays into the nose as needed. Reported on 01/11/2016     No current facility-administered medications for this visit.    OBJECTIVE: Older white woman In no acute distress Filed Vitals:   02/09/16 1200  BP: 140/68  Pulse: 87  Temp: 98 F (36.7 C)  Resp: 18     Body mass index is 20.24 kg/(m^2).    ECOG FS:1 - Symptomatic but completely ambulatory  Sclerae unicteric, EOMs intact Oropharynx clear, dentition in good repair No cervical or supraclavicular adenopathy Lungs no rales or rhonchi Heart regular rate and rhythm Abd soft, nontender, positive bowel sounds MSK no focal spinal tenderness, no upper extremity lymphedema Neuro: nonfocal, well oriented, appropriate affect Breasts: The right breast is unremarkable. The left breast status post lumpectomy and radiation. The cosmetic result is good. There is no evidence of disease recurrence. The left axilla is benign.    LAB RESULTS:  CMP     Component Value Date/Time   NA 143 07/05/2015 1530   K 3.9 07/05/2015 1530   CO2 30* 07/05/2015 1530   GLUCOSE 83 07/05/2015 1530   BUN 17.5 07/05/2015 1530   CREATININE 0.8 07/05/2015 1530   CALCIUM 9.2 07/05/2015 1530   PROT 6.4 07/05/2015 1530   ALBUMIN 3.6 07/05/2015 1530   AST 27 07/05/2015 1530   ALT 24 07/05/2015 1530   ALKPHOS 100 07/05/2015 1530   BILITOT 0.34 07/05/2015 1530    INo results found for: SPEP, UPEP  Lab Results  Component Value Date   WBC 5.1 02/09/2016   NEUTROABS 3.5 02/09/2016   HGB 10.7* 02/09/2016   HCT 35.4 02/09/2016   MCV 76.7* 02/09/2016   PLT 182 02/09/2016      Chemistry      Component Value Date/Time   NA 143 07/05/2015 1530   K 3.9 07/05/2015 1530   CO2 30* 07/05/2015 1530   BUN 17.5 07/05/2015 1530   CREATININE 0.8 07/05/2015 1530      Component Value Date/Time   CALCIUM 9.2 07/05/2015 1530   ALKPHOS 100 07/05/2015  1530   AST 27 07/05/2015 1530   ALT 24 07/05/2015 1530   BILITOT 0.34 07/05/2015 1530       No results found for: LABCA2  No components found for: LABCA125  No results for input(s): INR in the last 168 hours.  Urinalysis No results found for: COLORURINE, APPEARANCEUR, LABSPEC, PHURINE, GLUCOSEU, HGBUR, BILIRUBINUR, KETONESUR, PROTEINUR, UROBILINOGEN, NITRITE, LEUKOCYTESUR  STUDIES: No results found.   ASSESSMENT: 75 y.o. [place], [place] woman  (1) possible Lynch syndrome family: patient demurs re. Genetics testing  (2) status post TAH/BSO in 1995 for a  stage IB, grade 2 endometrial cancer  (a) biopsy of lytic bone lesion in 2000 confirmed metastatic recurrence  (b) status post pelvic irradiation completed May 2000  (c) on adjuvant Megace for 5 years, completed 2005  (3) status post left breast biopsy 06/09/2015, for a clinical T1c N1, stage II a invasive ductal carcinoma with neuroendocrine features, estrogen and progesterone receptor positive, HER-2 not amplified, with an MIB-1 of 20%  (4) left lumpectomy 08/04/2015 showed a pT1c pN0, stage IA invasive ductal carcinoma, grade 2, estrogen and progesterone receptor positive, HER-2 not amplified  (5) Oncotype score of 3 predicts a risk of recurrence outside the breast within 10 years of 4% if the patient's only systemic therapy is tamoxifen for 5 years. It also predicts no benefit from chemotherapy  (6) adjuvant radiation done in [facility]-- completed 11/05/2014  (7) started tamoxifen 11/17/2015  (8) osteopenia, with a T score of -2.4 on bone density scan 09/16/2015  PLAN: Jaxson is doing terrific so far with tamoxifen and the plan will be to continue that a minimum of 5 years. If she accomplished is that her the Oncotype, she will have an excellent prognosis.  Of course the tamoxifen also will help with the osteopenia problem. I suggested she also started walking program and she tells me there is 1 through the senior  citizen's group area  We discussed genetics testing. There is a strong labor of Lynch syndrome in this family, but she is not motivated to be tested because "and so all then all my relatives are older". Of course her daughter is younger. At any rate I think it would be important for her daughter to be screened appropriately and I gave her the name of some gastroenterologists here in [place] she can contact to get that accomplished.  Otherwise the patient is seen Dr. Ninfa Linden and every February and Dr. Ronnald Ramp every October. She will see me in July and then yearly assuming she continues to tolerate tamoxifen well  She knows to call for any problems that may develop before her next visit here.  Marland KitchenChauncey Cruel, MD   02/09/2016 12:06 PM Medical Oncology and Hematology [facility] [address] [place], [place] [address] Tel. [phone]    Fax. [phone]

## 2016-02-10 LAB — VITAMIN D 25 HYDROXY (VIT D DEFICIENCY, FRACTURES): VIT D 25 HYDROXY: 36.3 ng/mL (ref 30.0–100.0)

## 2016-02-14 ENCOUNTER — Encounter: Payer: Self-pay | Admitting: Oncology

## 2016-02-14 ENCOUNTER — Other Ambulatory Visit: Payer: Self-pay | Admitting: Oncology

## 2016-05-03 ENCOUNTER — Ambulatory Visit (HOSPITAL_BASED_OUTPATIENT_CLINIC_OR_DEPARTMENT_OTHER): Payer: Medicare Other | Admitting: Oncology

## 2016-05-03 ENCOUNTER — Telehealth: Payer: Self-pay | Admitting: Oncology

## 2016-05-03 ENCOUNTER — Other Ambulatory Visit (HOSPITAL_BASED_OUTPATIENT_CLINIC_OR_DEPARTMENT_OTHER): Payer: Medicare Other

## 2016-05-03 VITALS — BP 129/64 | HR 87 | Temp 97.9°F | Resp 18 | Ht 65.0 in | Wt 122.5 lb

## 2016-05-03 DIAGNOSIS — Z8542 Personal history of malignant neoplasm of other parts of uterus: Secondary | ICD-10-CM

## 2016-05-03 DIAGNOSIS — C541 Malignant neoplasm of endometrium: Secondary | ICD-10-CM

## 2016-05-03 DIAGNOSIS — M8580 Other specified disorders of bone density and structure, unspecified site: Secondary | ICD-10-CM

## 2016-05-03 DIAGNOSIS — Z79810 Long term (current) use of selective estrogen receptor modulators (SERMs): Secondary | ICD-10-CM | POA: Diagnosis not present

## 2016-05-03 DIAGNOSIS — C50912 Malignant neoplasm of unspecified site of left female breast: Secondary | ICD-10-CM

## 2016-05-03 DIAGNOSIS — C50412 Malignant neoplasm of upper-outer quadrant of left female breast: Secondary | ICD-10-CM

## 2016-05-03 DIAGNOSIS — N393 Stress incontinence (female) (male): Secondary | ICD-10-CM

## 2016-05-03 LAB — CBC & DIFF AND RETIC
BASO%: 0.4 % (ref 0.0–2.0)
Basophils Absolute: 0 10*3/uL (ref 0.0–0.1)
EOS%: 2.2 % (ref 0.0–7.0)
Eosinophils Absolute: 0.1 10*3/uL (ref 0.0–0.5)
HEMATOCRIT: 31.7 % — AB (ref 34.8–46.6)
HGB: 10.1 g/dL — ABNORMAL LOW (ref 11.6–15.9)
IMMATURE RETIC FRACT: 5.8 % (ref 1.60–10.00)
LYMPH#: 1.4 10*3/uL (ref 0.9–3.3)
LYMPH%: 27.7 % (ref 14.0–49.7)
MCH: 24.3 pg — ABNORMAL LOW (ref 25.1–34.0)
MCHC: 31.9 g/dL (ref 31.5–36.0)
MCV: 76.2 fL — AB (ref 79.5–101.0)
MONO#: 0.3 10*3/uL (ref 0.1–0.9)
MONO%: 5.1 % (ref 0.0–14.0)
NEUT%: 64.6 % (ref 38.4–76.8)
NEUTROS ABS: 3.2 10*3/uL (ref 1.5–6.5)
PLATELETS: 160 10*3/uL (ref 145–400)
RBC: 4.16 10*6/uL (ref 3.70–5.45)
RDW: 15.6 % — AB (ref 11.2–14.5)
Retic %: 0.69 % — ABNORMAL LOW (ref 0.70–2.10)
Retic Ct Abs: 28.7 10*3/uL — ABNORMAL LOW (ref 33.70–90.70)
WBC: 4.9 10*3/uL (ref 3.9–10.3)
nRBC: 0 % (ref 0–0)

## 2016-05-03 LAB — COMPREHENSIVE METABOLIC PANEL
ALBUMIN: 3.3 g/dL — AB (ref 3.5–5.0)
ALK PHOS: 68 U/L (ref 40–150)
ALT: 20 U/L (ref 0–55)
ANION GAP: 9 meq/L (ref 3–11)
AST: 20 U/L (ref 5–34)
BUN: 23.7 mg/dL (ref 7.0–26.0)
CALCIUM: 9.1 mg/dL (ref 8.4–10.4)
CHLORIDE: 107 meq/L (ref 98–109)
CO2: 26 mEq/L (ref 22–29)
Creatinine: 1 mg/dL (ref 0.6–1.1)
EGFR: 56 mL/min/{1.73_m2} — AB (ref >90)
Glucose: 148 mg/dl — ABNORMAL HIGH (ref 70–140)
POTASSIUM: 4 meq/L (ref 3.5–5.1)
Sodium: 142 mEq/L (ref 136–145)
Total Bilirubin: 0.3 mg/dL (ref 0.20–1.20)
Total Protein: 6.4 g/dL (ref 6.4–8.3)

## 2016-05-03 LAB — CHCC SMEAR

## 2016-05-03 MED ORDER — TAMOXIFEN CITRATE 20 MG PO TABS
20.0000 mg | ORAL_TABLET | Freq: Every day | ORAL | Status: AC
Start: 2016-05-03 — End: 2016-06-02

## 2016-05-03 NOTE — Telephone Encounter (Signed)
appt made and avs printed. Referral to Dr Wendy Poet not in so staff message was sent

## 2016-05-03 NOTE — Progress Notes (Signed)
[place]  [phone] Fax:([phone]) [phone]     ID: OVIE EASTEP DOB: 18-Nov-1940  MR#: [id number]  QAS#:[id number]  Patient Care Team: Eulogio Ditch, NP as PCP - General (Family Medicine) Chauncey Cruel, MD as Consulting Physician (Oncology) Gery Pray, MD as Consulting Physician (Radiation Oncology) Coralie Keens, MD as Consulting Physician (General Surgery) Sylvan Cheese, NP as Nurse Practitioner (Hematology and Oncology) PCP: Eulogio Ditch, NP GYN: Mellody Drown MD, SU: Coralie Keens MD OTHER MD: Shari Prows PA-C, Sherren Mocha McDiarmid MD  CHIEF COMPLAINT: Estrogen receptor positive breast cancer; history of endometrial cancer  CURRENT TREATMENT: Tamoxifen   BREAST CANCER HISTORY: From the original intake note:  Angel Palmer had a little bit of itching in her left breast area which led her to palpate a mass there. She brought this to her primary care physician's attention. Left diagnostic mammography with ultrasonography at [facility] diagnostic imaging 06/07/2015 showed an area of asymmetry in the left breast which was not seen in exams from June 2015 or May 2013. A second area of asymmetry in the inner aspect of the left breast was stable. The breast density was category C. Ultrasound of the left breast showed a hypoechoic irregular mass at the 1:00 position near the edge of the areola, measuring 1.4 cm maximally. There was a fatty replaced lymph node in the axilla measuring 2 cm.  Biopsy of this area was obtained 06/09/2015, and showed ([id number]) and invasive ductal carcinoma with neuroendocrine features, grade 2, with no evidence of lymphovascular invasion. Estrogen and progesterone were both positive in greater than 50% of the nuclei. HER-2 stain was negative. E-cadherin was strongly positive. He 67 was 20%. P53 was weakly reactive in approximately 2% of the nuclei. Immunostains for neuroendocrine features showed week focal positivity for  signed up to 57 and CD 56. It was nonreactive for chromogranin and neuron specific enolase  The patient's subsequent history is as detailed below   HISTORY OF ENDOMETRIAL CANCER: From Dr Joannie Springs 03/25/2015 summary note:  Angel Palmer underwent laparoscopically assisted vaginal hysterectomy, bilateral salpingo oophorectomy, and laparoscopic pelvic lymph node dissection in November, 1995. Final pathology revealed a moderately differentiated endometrioid adenocarcinoma. There was extensive involvement of the endometrial surface, but the maximal depth of invasion was less than 1 mm. Since she had Stage I-B, Grade 2, endometrial carcinoma with very minimal invasion and negative nodes, we elected not to treat her with any adjuvant therapy. She did well until January, 2000 when she developed right hip pain and was found to have a lytic lesion involving the right acetabulum. A CT scan showed 2 soft tissue masses in the pelvis, one of which was on the side wall involving the right hip, the second being more medially in the right peri-rectal space. Fine needle aspirate confirmed recurrence of endometrial cancer and she was treated with external pelvic radiation, which she completed in May, 2000. She had an excellent response to therapy with complete disappearance of the disease palpable on pelvic exam. Because of the bony involvement she has continued difficulties walking and uses a cane. In addition, she required intermittent narcotics for pain relief. She received adjuvant Megace therapy for 5 years.    INTERVAL HISTORY: Angel Palmer returns today for follow-up of her estrogen receptor positive breast cancer. She continues on tamoxifen, with excellent tolerance. She doesn't have problems with hot flashes. She does have significant problems with wetness, but it is unclear how much of that is due to tamoxifen and how much  to her severe stress urinary incontinence. She has to change pads 4-5 times a  day.   REVIEW OF SYSTEMS: Angel Palmer still has a little postoperative pain but understands this is benign. She describes herself is mildly fatigued. She also has pain in her lower back which is not more intense or persistent than before. She has a dry cough at times and does have a history of reflux. She also has mild sinus symptoms. She has a fibroma on her left cheek. She keeps busy with yard work, and taking care of her sister and brother each of which is in a separate nursing home in opposite directions out of town. He detailed review of systems today was otherwise noncontributory  PAST MEDICAL HISTORY: Past Medical History  Diagnosis Date  . 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 Osteoarthritis   . Depression with anxiety   . Endometrial/uterine adenocarcinoma (HCC)   . 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 Hypertension   . High cholesterol   . Neuromuscular disorder (HCC)     neuropathy in feet  . Chronic low back pain      PAST SURGICAL HISTORY: Past Surgical History  Procedure Laterality Date  . Total abdominal hysterectomy w/ bilateral salpingoophorectomy      1995  . Hand surgery Left   . Breast lumpectomy with radioactive seed and sentinel lymph node biopsy Left 08/04/2015    Procedure: LEFT BREAST LUMPECTOMY WITH RADIOACTIVE SEED AND SENTINEL LYMPH NODE BIOPSY;  Surgeon: Douglas Blackman, MD;  Location:  SURGERY CENTER;  Service: General;  Laterality: Left;   FAMILY HISTORY Family History  Problem Relation Age of Onset  . Breast cancer Sister   . Ovarian cancer Sister   . Breast cancer Sister   . Cancer Sister    the patient's father died from a stroke at age 92. The patient's mother died from colon cancer at age 84. The patient had one brother, 4 sisters. 2 of the patient's sisters had breast cancer, one had ovarian cancer diagnosed age 42 and the other one had endometrial cancer. Note that the patient's daughter has been tested for the BRCA  mutation and is negative   GYNECOLOGIC HISTORY:  No LMP recorded. Patient has had a hysterectomy. Menarche age 9, first live birth age 23. The patient underwent TAH/BSO in 1995. She did not take hormone replacement. 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 She is widowed and lives by herself with a long haired she while. Her daughter him early Palmer lives in [place] and works as a receptionist. She had noninvasive breast cancer diagnosed in 2014. The patient has 1 grandchild.    ADVANCED DIRECTIVES: the patient's advanced directives have been scanned. She has named her daughter Angel Palmer as her healthcare power of attorney. The patient has a living will in place.  HEALTH MAINTENANCE: Social History  Substance Use Topics  . Smoking status: Never Smoker   . Smokeless tobacco: Not on file  . Alcohol Use: No     Colonoscopy: 2014  PAP: Status post hysterectomy  Bone density: 2012  Lipid panel:  Allergies  Allergen Reactions  . Aspercreme Lidocaine [Lidocaine] Rash    After use several days   . Cephalosporins Rash    Other Reaction: Unknown to CEPHALEXIN  .   Ciprofloxacin Rash  . Penicillins Rash    Other Reaction: Unknown to AMOXICILLIN    Current Outpatient Prescriptions  Medication Sig Dispense Refill  . amLODipine (NORVASC) 5 MG tablet Take 5 mg by mouth daily.    . Calcium-Vitamin D (CALTRATE 600 PLUS-VIT D PO) Take 600 mg by mouth as needed.    . DULoxetine (CYMBALTA) 30 MG capsule Take 30 mg by mouth daily.    . pravastatin (PRAVACHOL) 10 MG tablet Take 10 mg by mouth daily. Reported on 01/11/2016    . tamoxifen (NOLVADEX) 20 MG tablet Take 1 tablet (20 mg total) by mouth daily. 90 tablet 12  . traMADol (ULTRAM) 50 MG tablet Take 50 mg by mouth 3 (three) times daily. Reported on  01/11/2016    . triamcinolone (NASACORT) 55 MCG/ACT AERO nasal inhaler Place 2 sprays into the nose as needed. Reported on 01/11/2016     No current facility-administered medications for this visit.    OBJECTIVE: Older white woman Who appears stated age  Filed Vitals:   05/03/16 1531  BP: 129/64  Pulse: 87  Temp: 97.9 F (36.6 C)  Resp: 18     Body mass index is 20.39 kg/(m^2).    ECOG FS:1 - Symptomatic but completely ambulatory  Sclerae unicteric, pupils round and equal Oropharynx clear and moist-- no thrush or other lesions No cervical or supraclavicular adenopathy Lungs no rales or rhonchi Heart regular rate and rhythm Abd soft, nontender, positive bowel sounds, without bruit left lower quadrant, previously noted MSK no focal spinal tenderness, no upper extremity lymphedema Neuro: nonfocal, well oriented, appropriate affect Breasts: The right breast is unremarkable. The left breast is status post lumpectomy followed by radiation. There is no evidence of local recurrence. The left axilla is benign.      LAB RESULTS:  CMP     Component Value Date/Time   NA 142 05/03/2016 1451   K 4.0 05/03/2016 1451   CO2 26 05/03/2016 1451   GLUCOSE 148* 05/03/2016 1451   BUN 23.7 05/03/2016 1451   CREATININE 1.0 05/03/2016 1451   CALCIUM 9.1 05/03/2016 1451   PROT 6.4 05/03/2016 1451   ALBUMIN 3.3* 05/03/2016 1451   AST 20 05/03/2016 1451   ALT 20 05/03/2016 1451   ALKPHOS 68 05/03/2016 1451   BILITOT 0.30 05/03/2016 1451    INo results found for: SPEP, UPEP  Lab Results  Component Value Date   WBC 5.1 02/09/2016   NEUTROABS 3.5 02/09/2016   HGB 10.7* 02/09/2016   HCT 35.4 02/09/2016   MCV 76.7* 02/09/2016   PLT 182 02/09/2016      Chemistry      Component Value Date/Time   NA 142 05/03/2016 1451   K 4.0 05/03/2016 1451   CO2 26 05/03/2016 1451   BUN 23.7 05/03/2016 1451   CREATININE 1.0 05/03/2016 1451      Component Value Date/Time   CALCIUM 9.1 05/03/2016  1451   ALKPHOS 68 05/03/2016 1451   AST 20 05/03/2016 1451   ALT 20 05/03/2016 1451   BILITOT 0.30 05/03/2016 1451       No results found for: LABCA2  No components found for: LABCA125  No results for input(s): INR in the last 168 hours.  Urinalysis No results found for: COLORURINE, APPEARANCEUR, LABSPEC, PHURINE, GLUCOSEU, HGBUR, BILIRUBINUR, KETONESUR, PROTEINUR, UROBILINOGEN, NITRITE, LEUKOCYTESUR  STUDIES: No results found.   ASSESSMENT: 74 y.o[place], [place]  (1) possible Lynch syndrome family: patient demurs re. Genetics testing  (2) status post   TAH/BSO in 1995 for a stage IB, grade 2 endometrial cancer  (a) biopsy of lytic bone lesion in 2000 confirmed metastatic recurrence  (b) status post pelvic irradiation completed May 2000  (c) on adjuvant Megace for 5 years, completed 2005  (3) status post left breast upper outer quadrant biopsy 06/09/2015, for a clinical T1c N1, stage IIA invasive ductal carcinoma with neuroendocrine features, estrogen and progesterone receptor positive, HER-2 not amplified, with an MIB-1 of 20%  (4) left lumpectomy 08/04/2015 showed a pT1c pN0, stage IA invasive ductal carcinoma, grade 2, estrogen and progesterone receptor positive, HER-2 not amplified  (5) Oncotype score of 3 predicts a risk of recurrence outside the breast within 10 years of 4% if the patient's only systemic therapy is tamoxifen for 5 years. It also predicts no benefit from chemotherapy  (6) adjuvant radiation done in [place]-- completed 11/05/2014  (7) started tamoxifen 11/17/2015  (8) osteopenia, with a T score of -2.4 on bone density scan 09/16/2015  PLAN: Angel Palmer is now nearly a year out from definitive surgery for her breast cancer with no evidence of disease recurrence. This is very favorable area  She does have significant stress urinary incontinence. I don't think tamoxifen is the reason she has to change pads 4-5 times daily. We discussed referral  to urology and that referral was placed today.  I also mentioned the bruit in her left lower quadrant. She says this has been noted by many physicians in the past.  Otherwise she will see me again in February 2018, after her next mammography. We will likely start seeing her on a once a year basis thereafter with her surgeon seeing her 6 months later  Angel Palmer has a good understanding of this plan. She knows to call for any problems that may develop before her next visit here.    .MAGRINAT,GUSTAV C, MD   05/03/2016 7:29 PM Medical Oncology and Hematology [facility] [address] [place], [place] [address] Tel. [phone]    Fax. [phone]  

## 2016-05-04 ENCOUNTER — Encounter: Payer: Self-pay | Admitting: Oncology

## 2016-05-04 ENCOUNTER — Other Ambulatory Visit: Payer: Self-pay | Admitting: *Deleted

## 2016-05-04 DIAGNOSIS — D509 Iron deficiency anemia, unspecified: Secondary | ICD-10-CM

## 2016-05-04 DIAGNOSIS — D519 Vitamin B12 deficiency anemia, unspecified: Secondary | ICD-10-CM

## 2016-05-04 DIAGNOSIS — D539 Nutritional anemia, unspecified: Secondary | ICD-10-CM | POA: Insufficient documentation

## 2016-05-04 LAB — IRON AND TIBC
%SAT: 6 % — ABNORMAL LOW (ref 21–57)
IRON: 24 ug/dL — AB (ref 41–142)
TIBC: 429 ug/dL (ref 236–444)
UIBC: 405 ug/dL — ABNORMAL HIGH (ref 120–384)

## 2016-05-04 LAB — FOLATE RBC
FOLATE, HEMOLYSATE: 428.6 ng/mL
Hematocrit: 32.5 % — ABNORMAL LOW (ref 34.0–46.6)

## 2016-05-04 LAB — FERRITIN: FERRITIN: 8 ng/mL — AB (ref 9–269)

## 2016-05-05 ENCOUNTER — Telehealth: Payer: Self-pay

## 2016-05-05 ENCOUNTER — Encounter: Payer: Self-pay | Admitting: Internal Medicine

## 2016-05-05 LAB — VITAMIN B12: Vitamin B12: 259 pg/mL (ref 211–946)

## 2016-05-05 NOTE — Telephone Encounter (Signed)
-----   Message from Gatha Mayer, MD sent at 05/04/2016  3:42 PM EDT ----- Regarding: needa ppt w/ one of [place] re: iron def anemia See below and please contact her  For appt w/ one of [place]  Thanks ----- Message -----    From: Chauncey Cruel, MD    Sent: 05/04/2016   2:17 PM      To: Gatha Mayer, MD  [facility] Docker! This former pt of Dr Chrys Racer is now iron deficient. Could you or one of your partners work her up for me?  Thanks!  Gus     Ref Range 1d ago   Ferritin 9 - 269 ng/ml 8 (L)  Resulting Agency [facility]    Narrative    Performed At: [facility].        [place], [place] [address]    Specimen Collected: 05/03/16 2:51 PM Last Resulted: 05/04/16 9:25 AM               Iron and TIBC Status: Finalresult Visible to patient:  Not Released Dx:  Endometrial cancer ([place]); Breast canc...          Ref Range 1d ago   Iron 41 - 142 ug/dL 24 (L)  TIBC 236 - 444 ug/dL 429  UIBC 120 - 384 ug/dL 405 (H)  %SAT 21 - 57 % 6 (L)  Resulting Agency [facility]    Narrative

## 2016-05-05 NOTE — Telephone Encounter (Signed)
Patient notified and rescheduled to Ellouise Newer, [address] on 05/10/16 1:30

## 2016-05-10 ENCOUNTER — Encounter: Payer: Self-pay | Admitting: Physician Assistant

## 2016-05-10 ENCOUNTER — Ambulatory Visit (INDEPENDENT_AMBULATORY_CARE_PROVIDER_SITE_OTHER): Payer: Medicare Other | Admitting: Physician Assistant

## 2016-05-10 VITALS — BP 144/72 | HR 92 | Ht 63.25 in | Wt 121.1 lb

## 2016-05-10 DIAGNOSIS — D509 Iron deficiency anemia, unspecified: Secondary | ICD-10-CM | POA: Diagnosis not present

## 2016-05-10 DIAGNOSIS — R1032 Left lower quadrant pain: Secondary | ICD-10-CM

## 2016-05-10 DIAGNOSIS — I701 Atherosclerosis of renal artery: Secondary | ICD-10-CM | POA: Diagnosis not present

## 2016-05-10 DIAGNOSIS — R1031 Right lower quadrant pain: Secondary | ICD-10-CM

## 2016-05-10 NOTE — Progress Notes (Signed)
Chief Complaint: Iron deficiency anemia  HPI: Angel Palmer is a  75 year old female who was referred to me by Jana Hakim Virgie Dad, MD for a complaint of iron deficiency anemia .  Review of labs from 05/03/16 shows an iron low at 24, ferritin low at 8% saturation low at 6, vitamin B12 was normal at 259, hct was 32.5. Hemoglobin drawn on 02/09/16 was low at 10.7 with hct 35.4 with decreased MCV at 76.7.   Independent review of patient's outside records shows last colonoscopy on 10/18/10 which was normal done by Dr. Hulen Luster. 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 Most recent EGD was done on 12/13/10 by Dr. West Carbo, 2 small polyps were removed from the stomach. Pathology revealed mild chronic inflammation of the duodenum and inflamed hyperplastic polyps of the stomach. There were also findings consistent with H. pylori organisms in the biopsy of the stomach.  Today, the patient expresses that she does have a past history of endometrial as well as breast cancer. Apparently her endometrial cancer spread to her hip and she had chemotherapy treatment for this in 2001/2002, after this she has lived with chronic pain in her tailbone which seems to radiate up her back. They tell her this is likely due to nerve damage. Recently, this has increased and seems to wrap around her lower abdomen. This is been occurring over the past 2 weeks and worsening. She expresses that over this time she has also had a change in her bowel movements. She notes that before this time she used to have 1 daily solid bowel movement and currently she has multiple thinner bowel movements which feel incomplete. The patient also tells me that she was told she had anemia in 2016 after her radiation treatments and this has continued until recently when they told her it was "worse". Associated symptoms include fatigue.  Patient denies nausea, vomiting, heartburn, reflux, fever, chills, blood in her stool, melena,  weight loss, anorexia, symptoms that awaken her at night, syncope or shortness of breath.   Past Medical History:  Diagnosis Date  . Chronic low back pain   . Depression with anxiety   . Endometrial/uterine adenocarcinoma ([place])   . High cholesterol   . Hypertension   . 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 Neuromuscular disorder (HCC)    neuropathy in feet  . 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 Osteoarthritis     Past Surgical History:  Procedure Laterality Date  . BREAST LUMPECTOMY WITH RADIOACTIVE SEED AND SENTINEL LYMPH NODE BIOPSY Left 08/04/2015   Procedure: LEFT BREAST LUMPECTOMY WITH RADIOACTIVE SEED AND SENTINEL LYMPH NODE BIOPSY;  Surgeon: Coralie Keens, MD;  Location: [facility];  Service: General;  Laterality: Left;  . HAND SURGERY Left   . TOTAL ABDOMINAL HYSTERECTOMY W/ BILATERAL SALPINGOOPHORECTOMY     1995    Current Outpatient Prescriptions  Medication Sig Dispense Refill  . amLODipine (NORVASC) 5 MG tablet Take 5 mg by mouth daily.    . Calcium-Vitamin D (CALTRATE 600 PLUS-VIT D PO) Take 600 mg by mouth as needed.    . DULoxetine (CYMBALTA) 30 MG capsule Take 30 mg by mouth daily.    . pravastatin (PRAVACHOL) 10 MG tablet Take 10 mg by mouth daily. Reported on 01/11/2016    . tamoxifen (NOLVADEX) 20 MG tablet Take 1 tablet (20 mg total) by mouth daily. 90 tablet 12  . traMADol (ULTRAM) 50 MG tablet Take  50 mg by mouth 3 (three) times daily. Reported on 01/11/2016    . triamcinolone (NASACORT) 55 MCG/ACT AERO nasal inhaler Place 2 sprays into the nose as needed. Reported on 01/11/2016     No current facility-administered medications for this visit.     Allergies as of 05/10/2016 - Review Complete 05/10/2016  Allergen Reaction Noted  . Aspercreme lidocaine [lidocaine] Rash 08/04/2015  . Cephalosporins Rash 07/06/2015  . Ciprofloxacin Rash 07/06/2015  . Penicillins Rash 07/06/2015    Family History  Problem Relation Age of  Onset  . Breast cancer Sister   . Ovarian cancer Sister   . Breast cancer Sister   . Cancer Sister     Social History   Social History  . Marital status: Married    Spouse name: N/A  . Number of children: N/A  . Years of education: N/A   Occupational History  . retired Radiation protection practitioner    Social History Main Topics  . Smoking status: Never Smoker  . Smokeless tobacco: Not on file  . Alcohol use No  . Drug use: No  . Sexual activity: Not Currently   Other Topics Concern  . Not on file   Social History Narrative  . No narrative on file    Review of Systems:    Constitutional: Positive for weakness and fatigue No weight loss, fever or chills HEENT: Eyes: No change in vision               Ears, Nose, Throat:  No change in hearing Skin: No rash or itching Cardiovascular: No chest pain, chest pressure or palpitations  Respiratory: No SOB or cough Gastrointestinal: See HPI and otherwise negative Genitourinary: Positive for increase in urine output recently Neurological: No headache, dizziness or syncope Musculoskeletal: See history of present illness  Hematologic: Positive for anemia No bleeding or bruising Psychiatric: No history of depression or anxiety   Physical Exam:  Vital signs: BP (!) 144/72 (BP Location: Right Arm, Patient Position: Sitting, Cuff Size: Normal)   Pulse 92   Ht 5' 3.25" (1.607 m) Comment: height measured without shoes  Wt 121 lb 2 oz (54.9 kg)   BMI 21.29 kg/m  General:   Pleasant elderly Caucasian female appears to be in NAD, Well developed, Well nourished, alert and cooperative Head:  Normocephalic and atraumatic. Eyes:   PEERL, EOMI. No icterus. Conjunctiva pink. Ears:  Normal auditory acuity. Neck:  Supple Throat: Oral cavity and pharynx without inflammation, swelling or lesion. Lungs: Respirations even and unlabored. Lungs clear to auscultation bilaterally.   No wheezes, crackles, or rhonchi.  Heart: Normal S1, S2. No MRG. Regular rate and  rhythm. No peripheral edema, cyanosis or pallor.  Abdomen:  Soft, nondistended, mild TTP bilateral lower quadrants No rebound or guarding. Normal bowel sounds. No appreciable masses or hepatomegaly, murmur heard in the abdomen and bilateral upper quadrants Rectal:  Not performed.  Msk:  Symmetrical without gross deformities. Peripheral pulses intact.  Extremities:  Without edema, no deformity or joint abnormality.  Neurologic:  Alert and  oriented x4;  grossly normal neurologically.  Skin:   Dry and intact without significant lesions or rashes. Psychiatric: Oriented to person, place and time. Demonstrates good judgement and reason without abnormal affect or behaviors.  RELEVANT LABS AND IMAGING: CBC    Component Value Date/Time   WBC 5.1 02/09/2016 1143   RBC 4.61 02/09/2016 1143   HGB 10.7 (L) 02/09/2016 1143   HCT 32.5 (L) 05/03/2016 1451   HCT 35.4 02/09/2016 1143  PLT 182 02/09/2016 1143   MCV 76.7 (L) 02/09/2016 1143   MCH 23.3 (L) 02/09/2016 1143   MCHC 30.3 (L) 02/09/2016 1143   RDW 17.4 (H) 02/09/2016 1143   LYMPHSABS 1.0 02/09/2016 1143   MONOABS 0.5 02/09/2016 1143   EOSABS 0.1 02/09/2016 1143   BASOSABS 0.0 02/09/2016 1143    CMP     Component Value Date/Time   NA 142 05/03/2016 1451   K 4.0 05/03/2016 1451   CO2 26 05/03/2016 1451   GLUCOSE 148 (H) 05/03/2016 1451   BUN 23.7 05/03/2016 1451   CREATININE 1.0 05/03/2016 1451   CALCIUM 9.1 05/03/2016 1451   PROT 6.4 05/03/2016 1451   ALBUMIN 3.3 (L) 05/03/2016 1451   AST 20 05/03/2016 1451   ALT 20 05/03/2016 1451   ALKPHOS 68 05/03/2016 1451   BILITOT 0.30 05/03/2016 1451    Assessment: 1. Iron deficiency anemia: Patient with what looks like chronic anemia, recent decrease in iron and hematocrit, history of breast cancer as well as endometrial cancer; consider AV malformation versus PUD versus cancer versus other 2. Suspected renal artery stenosis?: Abdominal murmur heard today on exam, patient explains  this has been there for 2 years but has never been worked up, consider renal artery stenosis versus other stenotic disease 3. Bilateral lower abdominal pain: Related to a change in bowel habits towards constipation over the past 2 weeks; consider relation to nerve damage from radiation versus constipation versus other  Plan: 1. Recommend patient have an EGD and colonoscopy for further evaluation of iron deficiency anemia. We did discuss risks, benefits and limitations of this procedure and the patient agrees to proceed. 2. Recommend patient have a CT angiogram of her abdomen for further evaluation of abdominal murmur heard today on exam. 3. Recommend the patient increase fiber in her diet to at least 25-35 g per day with the use of fiber submitted such as Metamucil, Citrucel or Benefiber. She should also increase her daily water intake to at least 6-8 8 ounce glasses per day. 4. Patient to follow up as recommended after time of colonoscopy and endoscopy.  Ellouise Newer, PA-C [facility] 05/10/2016, 1:15 PM  Cc: Magrinat, Virgie Dad, MD

## 2016-05-10 NOTE — Patient Instructions (Addendum)
You have been scheduled for an endoscopy and colonoscopy. Please follow the written instructions given to you at your visit today. Please pick up your prep supplies at the pharmacy within the next 1-3 days. [place], [place], [address]  If you use inhalers (even only as needed), please bring them with you on the day of your procedure. Your physician has requested that you go to www.startemmi.com and enter the access code given to you at your visit today. This web site gives a general overview about your procedure. However, you should still follow specific instructions given to you by our office regarding your preparation for the procedure.   You have been scheduled for a CT Abd & Pelvis Angiogram scan  at [facility] ([address]---this is in the same building as [facility]).   You are scheduled on Monday 05-24-2016 at 9:30 am. You should arrive at 8:15 am prior to your appointment time for registration. Please follow the written instructions below on the day of your exam:  WARNING: IF YOU ARE ALLERGIC TO IODINE/X-RAY DYE, PLEASE NOTIFY RADIOLOGY IMMEDIATELY AT [phone]! YOU WILL BE GIVEN A 13 HOUR PREMEDICATION PREP.  1) Do not eat  anything after 7:30 am. (2 hours prior to your test) You may take any medications as prescribed with a small amount of water except for the following: Metformin, Glucophage, Glucovance, Avandamet, Riomet, Fortamet, Actoplus Met, Janumet, Glumetza or Metaglip. The above medications must be held the day of the exam AND 48 hours after the exam.  The purpose of you drinking the oral contrast is to aid in the visualization of your intestinal tract. The contrast solution may cause some diarrhea. Before your exam is started, you will be given a small amount of fluid to drink. Depending on your individual set of symptoms, you may also receive an intravenous injection of x-ray contrast/dye. Plan on being at [facility] for 30 minutes or long,  depending on the type of exam you are having performed.  If you have any questions regarding your exam or if you need to reschedule, you may call the CT department at [phone] between the hours of 8:00 am and 5:00 pm, Monday-Friday.  ________________________________________________________________________

## 2016-05-11 NOTE — Progress Notes (Signed)
Agree with assessment and plan as outlined. May be good to obtain imaging prior to endoscopies given history of malignancy. Thanks

## 2016-05-15 ENCOUNTER — Other Ambulatory Visit: Payer: Medicare Other

## 2016-05-18 ENCOUNTER — Telehealth: Payer: Self-pay | Admitting: Physician Assistant

## 2016-05-18 NOTE — Telephone Encounter (Signed)
LM  For the patient to advise she will not have to drink any contrast for this CT Enterography. I verified this with Stacy at [facility].

## 2016-05-24 ENCOUNTER — Ambulatory Visit (INDEPENDENT_AMBULATORY_CARE_PROVIDER_SITE_OTHER)
Admission: RE | Admit: 2016-05-24 | Discharge: 2016-05-24 | Disposition: A | Discharge status: Home / Self Care | Payer: Medicare Other | Source: Ambulatory Visit | Attending: Physician Assistant | Admitting: Physician Assistant

## 2016-05-24 ENCOUNTER — Telehealth: Payer: Self-pay | Admitting: Physician Assistant

## 2016-05-24 DIAGNOSIS — R1032 Left lower quadrant pain: Secondary | ICD-10-CM

## 2016-05-24 DIAGNOSIS — I701 Atherosclerosis of renal artery: Secondary | ICD-10-CM | POA: Diagnosis not present

## 2016-05-24 DIAGNOSIS — D509 Iron deficiency anemia, unspecified: Secondary | ICD-10-CM | POA: Diagnosis not present

## 2016-05-24 DIAGNOSIS — R1031 Right lower quadrant pain: Secondary | ICD-10-CM | POA: Diagnosis not present

## 2016-05-24 MED ORDER — IOPAMIDOL (ISOVUE-370) INJECTION 76%: 100.0000 mL | Freq: Once | INTRAVENOUS | Status: DC | PRN

## 2016-05-25 NOTE — Telephone Encounter (Signed)
The pt has been notified of the results and will follow up with PCP.

## 2016-06-22 ENCOUNTER — Telehealth: Payer: Self-pay

## 2016-06-22 NOTE — Telephone Encounter (Signed)
Drug-drug interaction alert received from [employer] for tamoxifen and duloxetine and forwarded to Dr Jana Hakim.

## 2016-07-05 ENCOUNTER — Encounter: Payer: Medicare Other | Admitting: Gastroenterology

## 2016-07-18 ENCOUNTER — Ambulatory Visit: Payer: Medicare Other | Admitting: Internal Medicine

## 2016-07-20 ENCOUNTER — Telehealth: Payer: Self-pay | Admitting: Physician Assistant

## 2016-07-20 NOTE — Telephone Encounter (Signed)
Pt was seen by Ellouise Newer and was advised to have endo colon, she was scheduled but had to cancel because of her ride. She now would like to set up the endo colon because her diarrhea has worsened.  Previsit and colon endo scheduled for 08/10/16 and 08/16/16.  Pt will call if she has any concerns

## 2016-08-10 ENCOUNTER — Ambulatory Visit (AMBULATORY_SURGERY_CENTER): Payer: Self-pay

## 2016-08-10 VITALS — Ht 65.0 in | Wt 118.0 lb

## 2016-08-10 DIAGNOSIS — D509 Iron deficiency anemia, unspecified: Secondary | ICD-10-CM

## 2016-08-10 MED ORDER — SUPREP BOWEL PREP KIT 17.5-3.13-1.6 GM/177ML PO SOLN: 1.0000 | Freq: Once | ORAL | 0 refills | Status: AC

## 2016-08-10 NOTE — Progress Notes (Signed)
No allergies to eggs or soy No past problems with anesthesia No diet meds No home oxygen  Declined emmi 

## 2016-08-16 ENCOUNTER — Other Ambulatory Visit (INDEPENDENT_AMBULATORY_CARE_PROVIDER_SITE_OTHER): Payer: Medicare Other

## 2016-08-16 ENCOUNTER — Other Ambulatory Visit: Payer: Self-pay

## 2016-08-16 ENCOUNTER — Encounter: Payer: Self-pay | Admitting: Gastroenterology

## 2016-08-16 ENCOUNTER — Ambulatory Visit (AMBULATORY_SURGERY_CENTER): Payer: Medicare Other | Admitting: Gastroenterology

## 2016-08-16 VITALS — BP 151/67 | HR 74 | Temp 98.0°F | Resp 15 | Ht 65.0 in | Wt 118.0 lb

## 2016-08-16 DIAGNOSIS — D509 Iron deficiency anemia, unspecified: Secondary | ICD-10-CM

## 2016-08-16 DIAGNOSIS — R197 Diarrhea, unspecified: Secondary | ICD-10-CM

## 2016-08-16 DIAGNOSIS — K317 Polyp of stomach and duodenum: Secondary | ICD-10-CM

## 2016-08-16 DIAGNOSIS — K2950 Unspecified chronic gastritis without bleeding: Secondary | ICD-10-CM | POA: Diagnosis not present

## 2016-08-16 LAB — CBC WITH DIFFERENTIAL/PLATELET
BASOS ABS: 0 10*3/uL (ref 0.0–0.1)
Basophils Relative: 0.6 % (ref 0.0–3.0)
EOS ABS: 0.1 10*3/uL (ref 0.0–0.7)
Eosinophils Relative: 2.5 % (ref 0.0–5.0)
HEMATOCRIT: 33.2 % — AB (ref 36.0–46.0)
Hemoglobin: 10.7 g/dL — ABNORMAL LOW (ref 12.0–15.0)
LYMPHS PCT: 22.4 % (ref 12.0–46.0)
Lymphs Abs: 1.3 10*3/uL (ref 0.7–4.0)
MCHC: 32.1 g/dL (ref 30.0–36.0)
MCV: 75.4 fl — AB (ref 78.0–100.0)
MONOS PCT: 7.4 % (ref 3.0–12.0)
Monocytes Absolute: 0.4 10*3/uL (ref 0.1–1.0)
Neutro Abs: 3.9 10*3/uL (ref 1.4–7.7)
Neutrophils Relative %: 67.1 % (ref 43.0–77.0)
Platelets: 178 10*3/uL (ref 150.0–400.0)
RBC: 4.4 Mil/uL (ref 3.87–5.11)
RDW: 15.6 % — ABNORMAL HIGH (ref 11.5–15.5)
WBC: 5.8 10*3/uL (ref 4.0–10.5)

## 2016-08-16 LAB — IBC PANEL
Iron: 46 ug/dL (ref 42–145)
SATURATION RATIOS: 9.1 % — AB (ref 20.0–50.0)
TRANSFERRIN: 362 mg/dL — AB (ref 212.0–360.0)

## 2016-08-16 LAB — FERRITIN: FERRITIN: 9.2 ng/mL — AB (ref 10.0–291.0)

## 2016-08-16 MED ORDER — SODIUM CHLORIDE 0.9 % IV SOLN: 500.0000 mL | INTRAVENOUS | Status: AC

## 2016-08-16 NOTE — Op Note (Signed)
[facility] Patient Name: Angel Palmer Procedure Date: 08/16/2016 3:01 PM MRN: [id number] Endoscopist: Remo Lipps P. Armbruster MD, MD Age: 75 Referring MD:  Date of Birth: 1940-12-08 Gender: Female Account #: [id number] Procedure:                Colonoscopy Indications:              Unexplained iron deficiency anemia Medicines:                Monitored Anesthesia Care Procedure:                Pre-Anesthesia Assessment:                           - Prior to the procedure, a History and Physical                            was performed, and patient medications and                            allergies were reviewed. The patient's tolerance of                            previous anesthesia was also reviewed. The risks                            and benefits of the procedure and the sedation                            options and risks were discussed with the patient.                            All questions were answered, and informed consent                            was obtained. Prior Anticoagulants: The patient has                            taken no previous anticoagulant or antiplatelet                            agents. ASA Grade Assessment: III - A patient with                            severe systemic disease. After reviewing the risks                            and benefits, the patient was deemed in                            satisfactory condition to undergo the procedure.                           After obtaining informed consent, the colonoscope  was passed under direct vision. Throughout the                            procedure, the patient's blood pressure, pulse, and                            oxygen saturations were monitored continuously. The                            Model CF-HQ190L [id number]) scope was introduced                            through the anus and advanced to the the cecum,                            identified by  appendiceal orifice and ileocecal                            valve. The colonoscopy was performed without                            difficulty. The patient tolerated the procedure                            well. The quality of the bowel preparation was                            poor. The ileocecal valve, appendiceal orifice, and                            rectum were photographed. Scope In: 3:23:21 PM Scope Out: 3:38:39 PM Scope Withdrawal Time: 0 hours 7 minutes 32 seconds  Total Procedure Duration: 0 hours 15 minutes 18 seconds  Findings:                 The perianal and digital rectal examinations were                            normal.                           A large amount of semi-liquid stool was found in                            the entire colon, making visualization difficult.                            The prep was not adequate for screening purposes.                           A single medium-sized angiodysplastic lesion                            without bleeding was found in the ascending colon.  The exam was otherwise without abnormality. With                            lavage the mucosa appeared normal, no inflammatory                            changes. Biopsies were taken to ensure no evidence                            of microscopic colitis given the patient's symptoms                            of diarrhea. No mass lesions or large polyps noted,                            but smaller polyps may not have been appreciated on                            this exam. Limited views of the ileum appeared                            normal, but was not deeply intubated.                           Biopsies for histology were taken with a cold                            forceps from the right colon and left colon for                            evaluation of microscopic colitis. Complications:            No immediate complications. Estimated blood loss:                             Minimal. Estimated Blood Loss:     Estimated blood loss was minimal. Impression:               - Preparation of the colon was poor and inadequate                            for screening purposes, however no mass lesions or                            large polyps noted.                           - A single non-bleeding colonic angiodysplastic                            lesion.                           - The examination was otherwise normal. No obvious  colonic inflammation.                           - Biopsies were taken with a cold forceps from the                            right colon and left colon for evaluation of                            microscopic colitis. Recommendation:           - Patient has a contact number available for                            emergencies. The signs and symptoms of potential                            delayed complications were discussed with the                            patient. Return to normal activities tomorrow.                            Written discharge instructions were provided to the                            patient.                           - Resume previous diet.                           - Continue present medications (iron                            supplementation).                           - Await pathology results.                           - Repeat colonoscopy for screening purposes due to                            bowel prep                           - Repeat CBC and iron studies on iron                            supplementation Remo Lipps P. Armbruster MD, MD 08/16/2016 3:46:18 PM This report has been signed electronically.

## 2016-08-16 NOTE — Progress Notes (Signed)
Spontaneous respirations throughout. VSS. Resting comfortably. To PACU on room air. Report to  Jane RN. 

## 2016-08-16 NOTE — Progress Notes (Signed)
Lab in to draw CBC and Iron studies.

## 2016-08-16 NOTE — Patient Instructions (Signed)
YOU HAD AN ENDOSCOPIC PROCEDURE TODAY AT [facility]:   Refer to the procedure report that was given to you for any specific questions about what was found during the examination.  If the procedure report does not answer your questions, please call your gastroenterologist to clarify.  If you requested that your care partner not be given the details of your procedure findings, then the procedure report has been included in a sealed envelope for you to review at your convenience later.  YOU SHOULD EXPECT: Some feelings of bloating in the abdomen. Passage of more gas than usual.  Walking can help get rid of the air that was put into your GI tract during the procedure and reduce the bloating. If you had a lower endoscopy (such as a colonoscopy or flexible sigmoidoscopy) you may notice spotting of blood in your stool or on the toilet paper. If you underwent a bowel prep for your procedure, you may not have a normal bowel movement for a few days.  Please Note:  You might notice some irritation and congestion in your nose or some drainage.  This is from the oxygen used during your procedure.  There is no need for concern and it should clear up in a day or so.  SYMPTOMS TO REPORT IMMEDIATELY:   Following lower endoscopy (colonoscopy or flexible sigmoidoscopy):  Excessive amounts of blood in the stool  Significant tenderness or worsening of abdominal pains  Swelling of the abdomen that is new, acute  Fever of 100F or higher   Following upper endoscopy (EGD)  Vomiting of blood or coffee ground material  New chest pain or pain under the shoulder blades  Painful or persistently difficult swallowing  New shortness of breath  Fever of 100F or higher  Black, tarry-looking stools  For urgent or emergent issues, a gastroenterologist can be reached at any hour by calling [phone].   DIET:  We do recommend a small meal at first, but then you may proceed to your regular diet.  Drink  plenty of fluids but you should avoid alcoholic beverages for 24 hours.  ACTIVITY:  You should plan to take it easy for the rest of today and you should NOT DRIVE or use heavy machinery until tomorrow (because of the sedation medicines used during the test).    FOLLOW UP: Our staff will call the number listed on your records the next business day following your procedure to check on you and address any questions or concerns that you may have regarding the information given to you following your procedure. If we do not reach you, we will leave a message.  However, if you are feeling well and you are not experiencing any problems, there is no need to return our call.  We will assume that you have returned to your regular daily activities without incident.  If any biopsies were taken you will be contacted by phone or by letter within the next 1-3 weeks.  Please call us at [phone] if you have not heard about the biopsies in 3 weeks.    SIGNATURES/CONFIDENTIALITY: You and/or your care partner have signed paperwork which will be entered into your electronic medical record.  These signatures attest to the fact that that the information above on your After Visit Summary has been reviewed and is understood.  Full responsibility of the confidentiality of this discharge information lies with you and/or your care-partner.  Await gastric biospy results.  Continue present medications(iron supplementations)  CBC  and iron studies today.

## 2016-08-16 NOTE — Op Note (Signed)
[facility] Patient Name: Angel Palmer Procedure Date: 08/16/2016 3:01 PM MRN: [id number] Endoscopist: Remo Lipps P. Butch Otterson MD, MD Age: 75 Referring MD:  Date of Birth: 1941-01-19 Gender: Female Account #: [id number] Procedure:                Upper GI endoscopy Indications:              Iron deficiency anemia, patient also endorses                            diarrhea since July Medicines:                Monitored Anesthesia Care Procedure:                Pre-Anesthesia Assessment:                           - Prior to the procedure, a History and Physical                            was performed, and patient medications and                            allergies were reviewed. The patient's tolerance of                            previous anesthesia was also reviewed. The risks                            and benefits of the procedure and the sedation                            options and risks were discussed with the patient.                            All questions were answered, and informed consent                            was obtained. Prior Anticoagulants: The patient has                            taken no previous anticoagulant or antiplatelet                            agents. ASA Grade Assessment: III - A patient with                            severe systemic disease. After reviewing the risks                            and benefits, the patient was deemed in                            satisfactory condition to undergo the procedure.  After obtaining informed consent, the endoscope was                            passed under direct vision. Throughout the                            procedure, the patient's blood pressure, pulse, and                            oxygen saturations were monitored continuously. The                            Model GIF-HQ190 [id number]) scope was introduced                            through the mouth, and  advanced to the second part                            of duodenum. The upper GI endoscopy was                            accomplished without difficulty. The patient                            tolerated the procedure well. Scope In: Scope Out: Findings:                 Esophagogastric landmarks were identified: the                            Z-line was found at 40 cm, the gastroesophageal                            junction was found at 40 cm and the upper extent of                            the gastric folds was found at 40 cm from the                            incisors.                           The exam of the esophagus was otherwise normal.                           A single small sessile polyp was found in the                            gastric body, suspect inflammatory versus                            hyperplastic. Biopsies were taken with a cold  forceps for histology.                           A few 3 to 4 mm sessile polyps were found in the                            gastric antrum, along with a benign lymphoid                            aggregate. Biopsies were taken with a cold forceps                            for histology.                           The exam of the stomach was otherwise normal.                           Biopsies were taken with a cold forceps in the                            gastric body and in the gastric antrum for                            Helicobacter pylori testing.                           The duodenal bulb and second portion of the                            duodenum were normal. Biopsies for histology were                            taken with a cold forceps for evaluation of celiac                            disease. Complications:            No immediate complications. Estimated blood loss:                            Minimal. Estimated Blood Loss:     Estimated blood loss was minimal. Impression:               -  Esophagogastric landmarks identified.                           - Esophagus otherwise normal                           - A single gastric polyp. Biopsied.                           - A few gastric polyps. Biopsied.                           -  Normal duodenal bulb and second portion of the                            duodenum. Biopsied.                           - Biopsies were taken with a cold forceps for                            Helicobacter pylori testing.                           No obvious cause for iron deficiency on EGD, will                            await biopsies. Recommendation:           - Patient has a contact number available for                            emergencies. The signs and symptoms of potential                            delayed complications were discussed with the                            patient. Return to normal activities tomorrow.                            Written discharge instructions were provided to the                            patient.                           - Resume previous diet.                           - Continue present medications.                           - Await pathology results. Remo Lipps P. Norita Meigs MD, MD 08/16/2016 3:54:06 PM This report has been signed electronically.

## 2016-08-16 NOTE — Progress Notes (Signed)
Called to room to assist during endoscopic procedure.  Patient ID and intended procedure confirmed with present staff. Received instructions for my participation in the procedure from the performing physician.  

## 2016-08-17 ENCOUNTER — Telehealth: Payer: Self-pay

## 2016-08-17 ENCOUNTER — Other Ambulatory Visit: Payer: Self-pay

## 2016-08-17 ENCOUNTER — Telehealth: Payer: Self-pay | Admitting: *Deleted

## 2016-08-17 MED ORDER — FERROUS SULFATE 325 (65 FE) MG PO TABS: 325.0000 mg | ORAL_TABLET | Freq: Every day | ORAL | 3 refills | Status: DC

## 2016-08-17 NOTE — Telephone Encounter (Signed)
No answer, message left for the patient. 

## 2016-08-17 NOTE — Telephone Encounter (Signed)
  Follow up Call-  Call back number 08/16/2016  Post procedure Call Back phone  # [phone]  Permission to leave phone message Yes  Some recent data might be hidden     Patient questions:  Do you have a fever, pain , or abdominal swelling? No. Pain Score  0 *  Have you tolerated food without any problems? Yes.    Have you been able to return to your normal activities? Yes.    Do you have any questions about your discharge instructions: Diet   No. Medications  No. Follow up visit  No.  Do you have questions or concerns about your Care? No.  Actions: * If pain score is 4 or above: No action needed, pain <4.

## 2016-08-25 ENCOUNTER — Other Ambulatory Visit: Payer: Self-pay

## 2016-08-25 DIAGNOSIS — D508 Other iron deficiency anemias: Secondary | ICD-10-CM

## 2016-09-01 ENCOUNTER — Other Ambulatory Visit: Payer: Self-pay

## 2016-09-20 ENCOUNTER — Encounter: Payer: Medicare Other | Admitting: Gastroenterology

## 2016-09-29 ENCOUNTER — Telehealth: Payer: Self-pay

## 2016-09-29 ENCOUNTER — Other Ambulatory Visit: Payer: Self-pay

## 2016-09-29 NOTE — Telephone Encounter (Signed)
-----   Message from Doristine Counter, RN sent at 08/25/2016  2:47 PM EST ----- Send reminder to patient to have her cbc rechecked.

## 2016-09-29 NOTE — Telephone Encounter (Signed)
Called and reminded patient to get lab work either at the end of this month or first of next month.

## 2016-11-16 ENCOUNTER — Telehealth: Payer: Self-pay | Admitting: Oncology

## 2016-11-16 NOTE — Telephone Encounter (Signed)
called patient to advise appointment rescheduled from 12/13/16 to 12/19/16. patient request new appointment time, transferred her to scheduling

## 2016-11-16 NOTE — Telephone Encounter (Signed)
Contacted patient re moving 2/28 f/u to 3/6 due to [facility]. Per patient she is not able to come 3/6 and would like lab and f/u same day due to she is coming from [place]. Gave patient new appointments for lab/fu 3/19 @ 1:30 pm.

## 2016-12-06 ENCOUNTER — Other Ambulatory Visit: Payer: Medicare Other

## 2016-12-13 ENCOUNTER — Ambulatory Visit: Payer: Medicare Other | Admitting: Oncology

## 2016-12-19 ENCOUNTER — Ambulatory Visit: Payer: Medicare Other | Admitting: Oncology

## 2017-01-01 ENCOUNTER — Ambulatory Visit (HOSPITAL_BASED_OUTPATIENT_CLINIC_OR_DEPARTMENT_OTHER): Payer: Medicare Other | Admitting: Oncology

## 2017-01-01 ENCOUNTER — Other Ambulatory Visit: Payer: Self-pay | Admitting: Emergency Medicine

## 2017-01-01 ENCOUNTER — Other Ambulatory Visit (HOSPITAL_BASED_OUTPATIENT_CLINIC_OR_DEPARTMENT_OTHER): Payer: Medicare Other

## 2017-01-01 VITALS — BP 154/74 | HR 91 | Temp 98.2°F | Resp 18 | Ht 65.0 in | Wt 121.0 lb

## 2017-01-01 DIAGNOSIS — Z79810 Long term (current) use of selective estrogen receptor modulators (SERMs): Secondary | ICD-10-CM | POA: Diagnosis not present

## 2017-01-01 DIAGNOSIS — R32 Unspecified urinary incontinence: Secondary | ICD-10-CM | POA: Diagnosis not present

## 2017-01-01 DIAGNOSIS — Z170 Estrogen receptor positive status [ER+]: Principal | ICD-10-CM

## 2017-01-01 DIAGNOSIS — C541 Malignant neoplasm of endometrium: Secondary | ICD-10-CM

## 2017-01-01 DIAGNOSIS — C50412 Malignant neoplasm of upper-outer quadrant of left female breast: Secondary | ICD-10-CM | POA: Diagnosis present

## 2017-01-01 DIAGNOSIS — L732 Hidradenitis suppurativa: Secondary | ICD-10-CM

## 2017-01-01 LAB — COMPREHENSIVE METABOLIC PANEL
ALT: 31 U/L (ref 0–55)
ANION GAP: 8 meq/L (ref 3–11)
AST: 27 U/L (ref 5–34)
Albumin: 3.4 g/dL — ABNORMAL LOW (ref 3.5–5.0)
Alkaline Phosphatase: 88 U/L (ref 40–150)
BUN: 20.9 mg/dL (ref 7.0–26.0)
CHLORIDE: 106 meq/L (ref 98–109)
CO2: 29 meq/L (ref 22–29)
CREATININE: 0.9 mg/dL (ref 0.6–1.1)
Calcium: 9.1 mg/dL (ref 8.4–10.4)
EGFR: 66 mL/min/{1.73_m2} — ABNORMAL LOW (ref >90)
Glucose: 114 mg/dl (ref 70–140)
Potassium: 4.2 mEq/L (ref 3.5–5.1)
SODIUM: 143 meq/L (ref 136–145)
Total Bilirubin: 0.31 mg/dL (ref 0.20–1.20)
Total Protein: 6.4 g/dL (ref 6.4–8.3)

## 2017-01-01 LAB — CBC WITH DIFFERENTIAL/PLATELET
BASO%: 0.8 % (ref 0.0–2.0)
Basophils Absolute: 0 10*3/uL (ref 0.0–0.1)
EOS%: 2.3 % (ref 0.0–7.0)
Eosinophils Absolute: 0.1 10*3/uL (ref 0.0–0.5)
HCT: 34.3 % — ABNORMAL LOW (ref 34.8–46.6)
HGB: 11.4 g/dL — ABNORMAL LOW (ref 11.6–15.9)
LYMPH%: 26.2 % (ref 14.0–49.7)
MCH: 28.2 pg (ref 25.1–34.0)
MCHC: 33.1 g/dL (ref 31.5–36.0)
MCV: 85.3 fL (ref 79.5–101.0)
MONO#: 0.5 10*3/uL (ref 0.1–0.9)
MONO%: 8.8 % (ref 0.0–14.0)
NEUT#: 3.6 10*3/uL (ref 1.5–6.5)
NEUT%: 61.9 % (ref 38.4–76.8)
Platelets: 181 10*3/uL (ref 145–400)
RBC: 4.02 10*6/uL (ref 3.70–5.45)
RDW: 14.5 % (ref 11.2–14.5)
WBC: 5.8 10*3/uL (ref 3.9–10.3)
lymph#: 1.5 10*3/uL (ref 0.9–3.3)

## 2017-01-01 NOTE — Progress Notes (Signed)
[facility]  Telephone:([phone]) [phone] Fax:(336) [phone]     ID: JASIYA MARKIE DOB: 09-May-1941  MR#: [id number]  YCX#:[id number]  Patient Care Team: Kennieth Rad, MD as PCP - General Chauncey Cruel, MD as Consulting Physician (Oncology) Gery Pray, MD as Consulting Physician (Radiation Oncology) Coralie Keens, MD as Consulting Physician (General Surgery) Sylvan Cheese, NP as Nurse Practitioner (Hematology and Oncology) PCP: Kennieth Rad, MD GYN: Mellody Drown MD, SU: Coralie Keens MD OTHER MD: Shari Prows PA-C, Sherren Mocha McDiarmid MD  CHIEF COMPLAINT: Estrogen receptor positive breast cancer; history of endometrial cancer  CURRENT TREATMENT: Tamoxifen   BREAST CANCER HISTORY: From the original intake note:  Earl had a little bit of itching in her left breast area which led her to palpate a mass there. She brought this to her primary care physician's attention. Left diagnostic mammography with ultrasonography at [facility] diagnostic imaging 06/07/2015 showed an area of asymmetry in the left breast which was not seen in exams from June 2015 or May 2013. A second area of asymmetry in the inner aspect of the left breast was stable. The breast density was category C. Ultrasound of the left breast showed a hypoechoic irregular mass at the 1:00 position near the edge of the areola, measuring 1.4 cm maximally. There was a fatty replaced lymph node in the axilla measuring 2 cm.  Biopsy of this area was obtained 06/09/2015, and showed ([id number]) and invasive ductal carcinoma with neuroendocrine features, grade 2, with no evidence of lymphovascular invasion. Estrogen and progesterone were both positive in greater than 50% of the nuclei. HER-2 stain was negative. E-cadherin was strongly positive. He 67 was 20%. P53 was weakly reactive in approximately 2% of the nuclei. Immunostains for neuroendocrine features showed week focal positivity for signed up to 57  and CD 56. It was nonreactive for chromogranin and neuron specific enolase  The patient's subsequent history is as detailed below   HISTORY OF ENDOMETRIAL CANCER: From Dr Joannie [place] 03/25/2015 summary note:  Shannan Harper underwent laparoscopically assisted vaginal hysterectomy, bilateral salpingo oophorectomy, and laparoscopic pelvic lymph node dissection in November, 1995. Final pathology revealed a moderately differentiated endometrioid adenocarcinoma. There was extensive involvement of the endometrial surface, but the maximal depth of invasion was less than 1 mm. Since she had Stage I-B, Grade 2, endometrial carcinoma with very minimal invasion and negative nodes, we elected not to treat her with any adjuvant therapy. She did well until January, 2000 when she developed right hip pain and was found to have a lytic lesion involving the right acetabulum. A CT scan showed 2 soft tissue masses in the pelvis, one of which was on the side wall involving the right hip, the second being more medially in the right peri-rectal space. Fine needle aspirate confirmed recurrence of endometrial cancer and she was treated with external pelvic radiation, which she completed in May, 2000. She had an excellent response to therapy with complete disappearance of the disease palpable on pelvic exam. Because of the bony involvement she has continued difficulties walking and uses a cane. In addition, she required intermittent narcotics for pain relief. She received adjuvant Megace therapy for 5 years.    INTERVAL HISTORY: Keaisha returns today for follow-up of her estrogen receptor positive breast cancer. Interval history is generally unremarkable. She continues on tamoxifen, with good tolerance. She has no significant problem with hot flashes or vaginal discharge. She obtains it at a good price.  REVIEW OF SYSTEMS: Torrance is taking a "  stronger" iron pill and tolerating it well. This is helping her anemia which is  almost resolved. She continues to have significant stress bladder incontinence. She had an appointment with urology but was unable to keep it last year. She would like to have that restarted because of problem is no better. She also has these "itchy bumps" in both armpits. She changes do not run all the time but it doesn't help very much. Aside from these issues she has a little bit of a morning cough, little bit of arthritis here and there, but no problems are clearly worsening in frequency or intensity. A detailed review of systems today was otherwise stable  PAST MEDICAL HISTORY: Past Medical History:  Diagnosis Date  . Anemia   . Anxiety   . Breast cancer ([place])   . Chronic low back pain   . Depression   . Depression with anxiety   . Endometrial cancer ([place])   . Endometrial/uterine adenocarcinoma ([place])   . High cholesterol   . Hypertension   . Lytic bone lesion of hip   . 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 Neuromuscular disorder ([facility])    neuropathy in feet  . 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 Osteoarthritis   . Pneumonia      PAST SURGICAL HISTORY: Past Surgical History:  Procedure Laterality Date  . BREAST LUMPECTOMY WITH RADIOACTIVE SEED AND SENTINEL LYMPH NODE BIOPSY Left 08/04/2015   Procedure: last finished up in March 2017 LEFT BREAST LUMPECTOMY WITH RADIOACTIVE SEED AND SENTINEL LYMPH NODE BIOPSY;  Surgeon: Coralie Keens, MD;  Location: [facility];  Service: General;  Laterality: Left;  . HAND SURGERY Left    Thumb  . TOTAL ABDOMINAL HYSTERECTOMY W/ BILATERAL SALPINGOOPHORECTOMY     1995   FAMILY HISTORY Family History  Problem Relation Age of Onset  . Stroke Father   . Heart disease Father   . Colon cancer Mother   . Ovarian cancer Sister   . Breast cancer Sister   . Breast cancer Sister   . Colon cancer Other     nephew and neice  . Breast cancer Other     neice   the patient's father died from a stroke at age 62. The  patient's mother died from colon cancer at age 88. The patient had one brother, 4 sisters. 2 of the patient's sisters had breast cancer, one had ovarian cancer diagnosed age 58 and the other one had endometrial cancer. Note that the patient's daughter has been tested for the BRCA mutation and is negative   GYNECOLOGIC HISTORY:  No LMP recorded. Patient has had a hysterectomy. Menarche age 51, first live birth age 66. The patient underwent TAH/BSO in 1995. She did not take hormone replacement. 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 She is widowed and lives by herself with a long haired she while. Her daughter him early Barrett lives in [place] and works as a Research scientist (physical sciences). She had noninvasive breast cancer diagnosed in 2014. The patient has 1 grandchild.    ADVANCED DIRECTIVES: the patient's advanced directives have been scanned. She has named her daughter Monico Blitz Barrett as her healthcare power of attorney. The patient has a living will in place.  HEALTH MAINTENANCE: Social History  Substance Use Topics  .  Smoking status: Never Smoker  . Smokeless tobacco: Never Used  . Alcohol use No     Colonoscopy: 2014  PAP: Status post hysterectomy  Bone density: 2012  Lipid panel:  Allergies  Allergen Reactions  . Aspercreme Lidocaine [Lidocaine] Rash    After use several days   . Cephalosporins Rash    Other Reaction: Unknown to CEPHALEXIN  . Ciprofloxacin Rash  . Penicillins Rash    Other Reaction: Unknown to AMOXICILLIN    Current Outpatient Prescriptions  Medication Sig Dispense Refill  . Calcium-Vitamin D (CALTRATE 600 PLUS-VIT D PO) Take 600 mg by mouth as needed.    . DULoxetine (CYMBALTA) 30 MG capsule Take 30 mg by mouth daily.    Marland Kitchen FeFum-FePo-FA-B Cmp-C-Zn-Mn-Cu (SE-TAN PLUS) 162-115.2-1 MG CAPS TK  1 C PO QD  3  . ferrous sulfate 325 (65 FE) MG tablet Take 1 tablet (325 mg total) by mouth daily with breakfast. 30 tablet 3  . potassium chloride (K-DUR,KLOR-CON) 10 MEQ tablet TK 1 T PO QD WF  3  . pravastatin (PRAVACHOL) 10 MG tablet Take 10 mg by mouth daily. Reported on 01/11/2016    . tamoxifen (NOLVADEX) 20 MG tablet TK 1 T PO D    . traMADol (ULTRAM) 50 MG tablet Take 50 mg by mouth 3 (three) times daily. Reported on 01/11/2016    . triamcinolone (NASACORT) 55 MCG/ACT AERO nasal inhaler Place 2 sprays into the nose as needed. Reported on 01/11/2016     Current Facility-Administered Medications  Medication Dose Route Frequency Provider Last Rate Last Dose  . 0.9 %  sodium chloride infusion  500 mL Intravenous Continuous Manus Gunning, MD        OBJECTIVE: Older white woman  Who appears well Vitals:   01/01/17 1352  BP: (!) 154/74  Pulse: 91  Resp: 18  Temp: 98.2 F (36.8 C)     Body mass index is 20.14 kg/m.    ECOG FS:0 - Asymptomatic  Sclerae unicteric, EOMs intact Oropharynx clear and moist No cervical or supraclavicular adenopathy Lungs no rales or rhonchi Heart regular rate and rhythm Abd soft, nontender, positive bowel sounds MSK no focal spinal tenderness, no upper extremity lymphedema Neuro: nonfocal, well oriented, appropriate affect Breasts: The right breast is benign. The left breast as had lumpectomy followed by radiation with no evidence of local recurrence. Both axillae are benign.  LAB RESULTS:  CMP     Component Value Date/Time   NA 142 05/03/2016 1451   K 4.0 05/03/2016 1451   CO2 26 05/03/2016 1451   GLUCOSE 148 (H) 05/03/2016 1451   BUN 23.7 05/03/2016 1451   CREATININE 1.0 05/03/2016 1451   CALCIUM 9.1 05/03/2016 1451   PROT 6.4 05/03/2016 1451   ALBUMIN 3.3 (L) 05/03/2016 1451   AST 20 05/03/2016 1451   ALT 20 05/03/2016 1451   ALKPHOS 68 05/03/2016 1451   BILITOT 0.30 05/03/2016 1451    INo results found for: SPEP, UPEP  Lab  Results  Component Value Date   WBC 5.8 01/01/2017   NEUTROABS 3.6 01/01/2017   HGB 11.4 (L) 01/01/2017   HCT 34.3 (L) 01/01/2017   MCV 85.3 01/01/2017   PLT 181 01/01/2017      Chemistry      Component Value Date/Time   NA 142 05/03/2016 1451   K 4.0 05/03/2016 1451   CO2 26 05/03/2016 1451   BUN 23.7 05/03/2016 1451   CREATININE 1.0 05/03/2016 1451  Component Value Date/Time   CALCIUM 9.1 05/03/2016 1451   ALKPHOS 68 05/03/2016 1451   AST 20 05/03/2016 1451   ALT 20 05/03/2016 1451   BILITOT 0.30 05/03/2016 1451       No results found for: LABCA2  No components found for: LABCA125  No results for input(s): INR in the last 168 hours.  Urinalysis No results found for: COLORURINE, APPEARANCEUR, LABSPEC, PHURINE, GLUCOSEU, HGBUR, BILIRUBINUR, KETONESUR, PROTEINUR, UROBILINOGEN, NITRITE, LEUKOCYTESUR  STUDIES: No results found.   ASSESSMENT: 76 y.o. [place], [place] woman  (1) possible Lynch syndrome family: patient demurs re. Genetics testing  (2) status post TAH/BSO in 1995 for a stage IB, grade 2 endometrial cancer  (a) biopsy of lytic bone lesion in 2000 confirmed metastatic recurrence  (b) status post pelvic irradiation completed May 2000  (c) on adjuvant Megace for 5 years, completed 2005  (3) status post left breast upper outer quadrant biopsy 06/09/2015, for a clinical T1c N1, stage IIA invasive ductal carcinoma with neuroendocrine features, estrogen and progesterone receptor positive, HER-2 not amplified, with an MIB-1 of 20%  (4) left lumpectomy 08/04/2015 showed a pT1c pN0, stage IA invasive ductal carcinoma, grade 2, estrogen and progesterone receptor positive, HER-2 not amplified  (5) Oncotype score of 3 predicts a risk of recurrence outside the breast within 10 years of 4% if the patient's only systemic therapy is tamoxifen for 5 years. It also predicts no benefit from chemotherapy  (6) adjuvant radiation done in [facility]-- completed  11/05/2014  (7) started tamoxifen 11/17/2015  (8) osteopenia, with a T score of -2.4 on bone density scan 09/16/2015  PLAN: Wendelyn Is now a year and half out from definitive surgery for her breast cancer with no evidence of disease recurrence. This is very favorable.  She has taken tamoxifen for 1 year. The plan is to continue that for a total of 5 years. She is tolerating that with no unusual side effects.  The 2 big problems she is having are the bladder incontinence and hidradenitis. We had referred her to urology but she was unable to make that appointment. She would like a rereferral. This was entered today.  As far as the hidradenitis is concerned she is going to try is some antibiotic cream on one side and see that takes care of the problem. If not she will discuss it further with her primary care physician  This point I feel comfortable seeing her on a once a year basis. She is going to have mammography in June in [place]. She will make sure we get a copy. She will see me again March 2019  She will call with any problems that may develop before that visit.   Marland KitchenChauncey Cruel, MD   01/01/2017 2:06 PM Medical Oncology and Hematology [facility] [address] [place], [place] [address] Tel. [phone]    Fax. [phone]

## 2017-02-12 ENCOUNTER — Other Ambulatory Visit: Payer: Self-pay | Admitting: Oncology

## 2017-03-24 IMAGING — CT CT ANGIO ABDOMEN
2 of 9 series · 10 of 46 positions shown, 16 images · IV contrast (ISOVUE 370)
Comparison: none

CLINICAL DATA: Abdominal bruit. Evaluate for renal artery stenosis.
Bilateral lower abdominal pain.

EXAM:
CT ANGIOGRAPHY ABDOMEN
TECHNIQUE: Multidetector CT imaging of the abdomen and pelvis was performed
using the standard protocol during bolus administration of
intravenous contrast. Multiplanar reconstructed images including
MIPs were obtained and reviewed to evaluate the vascular anatomy.
CONTRAST:  100 mL Isovue 370

[Series 6: venous 5.0 i40f 2 · axial · portal-venous · 0.63mm/px · z∈[-254,-99]mm · 8 of 41 slices shown, 13 images]
[im 5/41  soft-tissue]
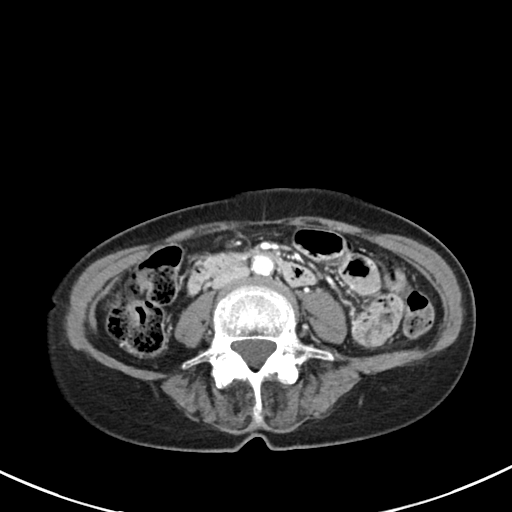
[im 5/41  bone]
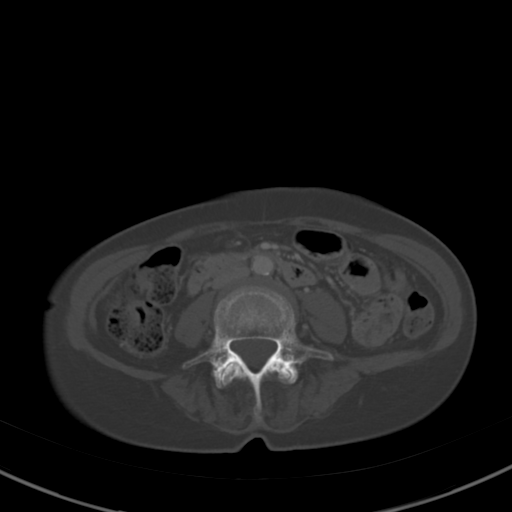
[im 9/41  soft-tissue]
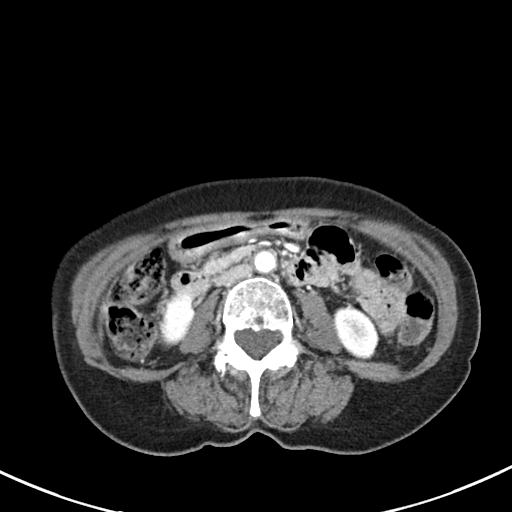
[im 14/41  soft-tissue]
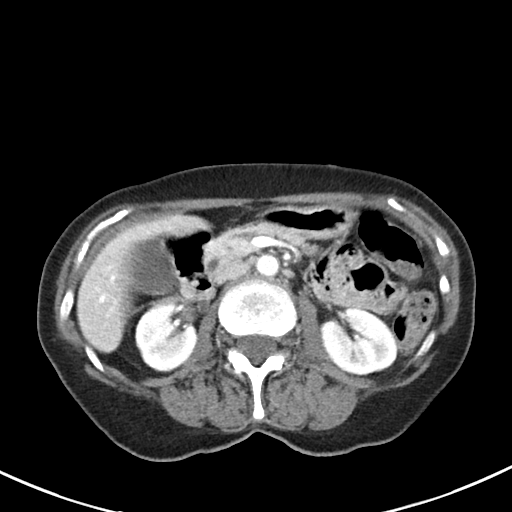
[im 18/41  soft-tissue]
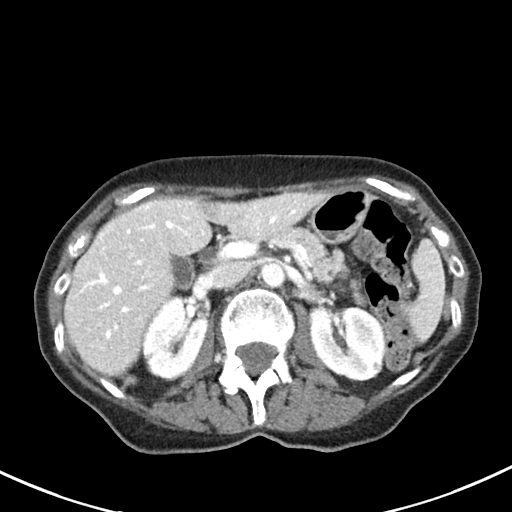
[im 23/41  soft-tissue]
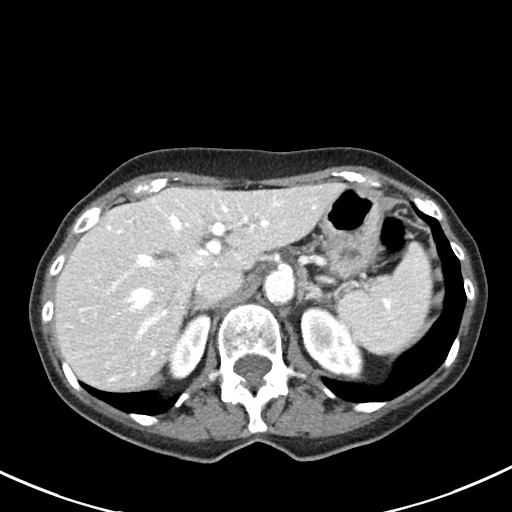
[im 23/41  lung]
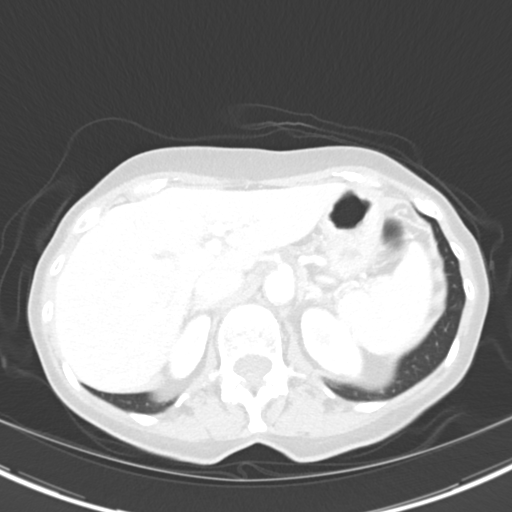
[im 27/41  soft-tissue]
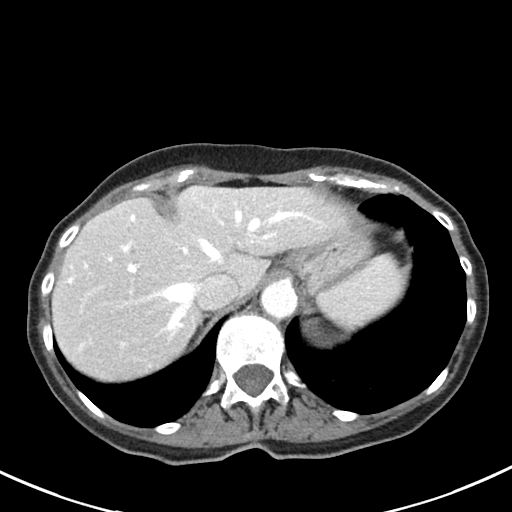
[im 27/41  lung]
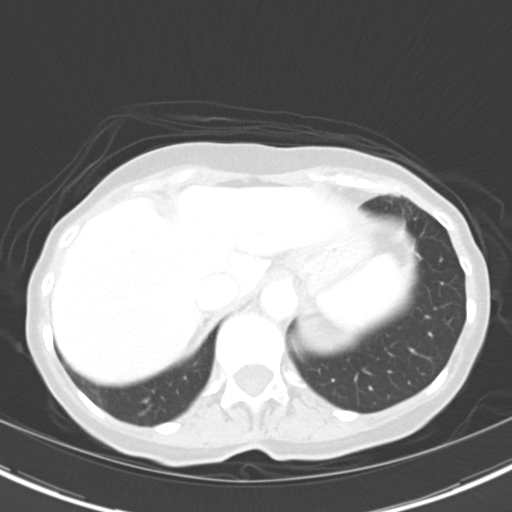
[im 32/41  soft-tissue]
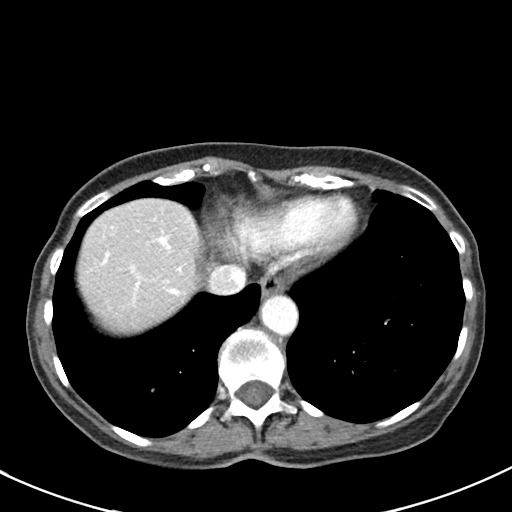
[im 32/41  lung]
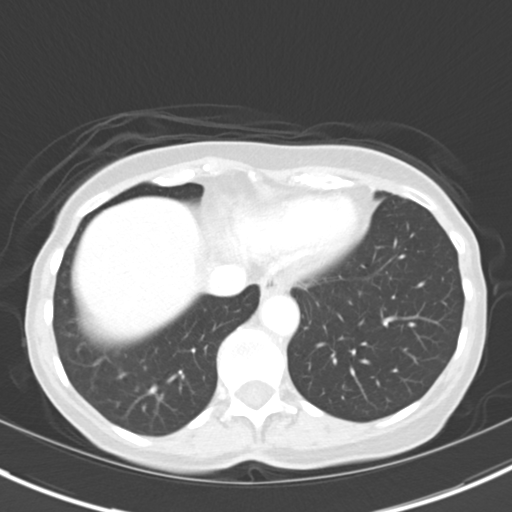
[im 36/41  soft-tissue]
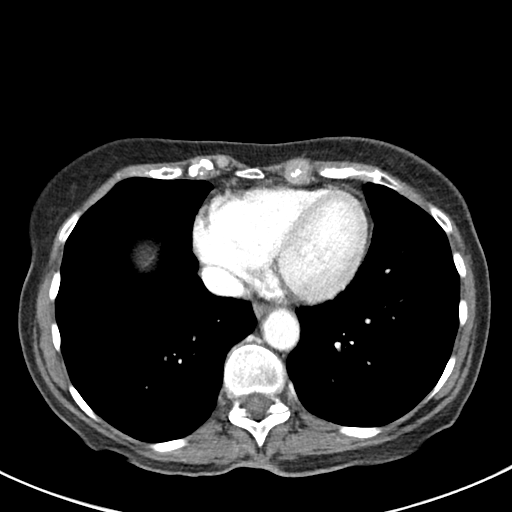
[im 36/41  lung]
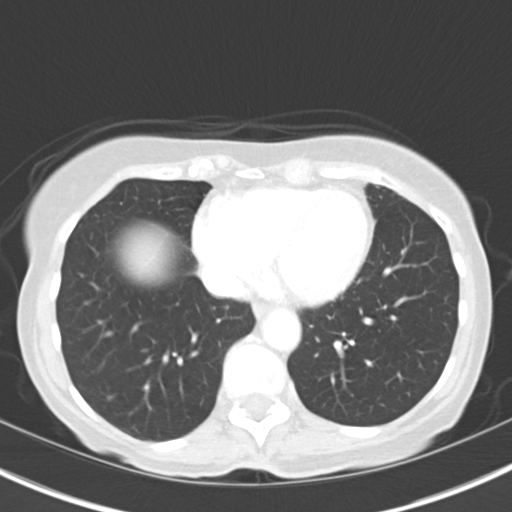

[Series 9: coronals · coronal · 0.46mm/px · 2 of 86 slices shown, 3 images]
[im 29/86  soft-tissue]
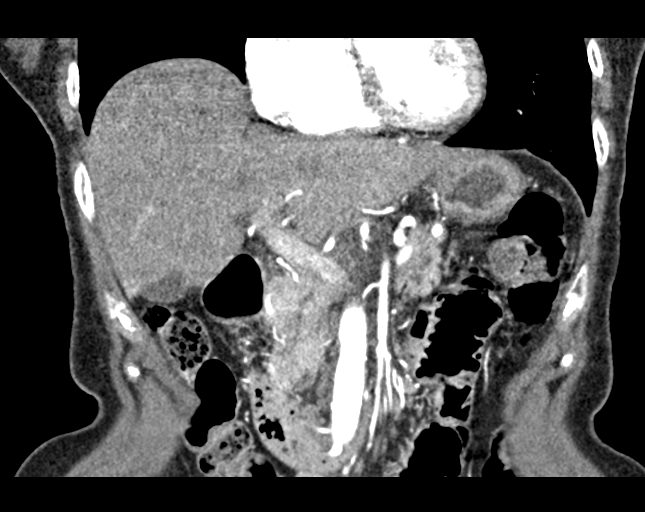
[im 29/86  bone]
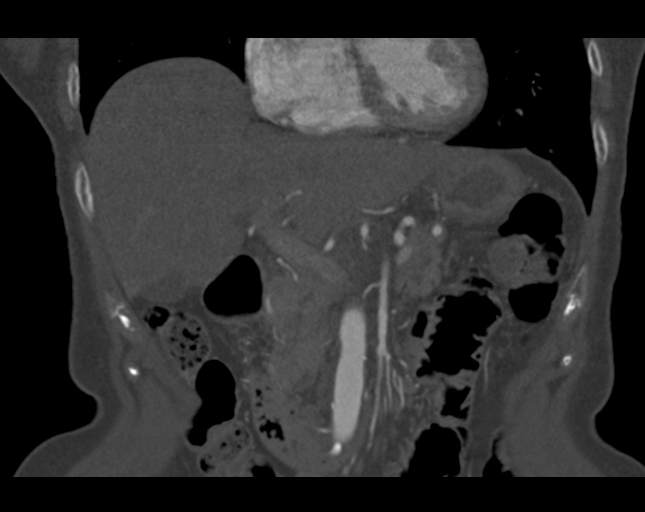
[im 57/86  soft-tissue]
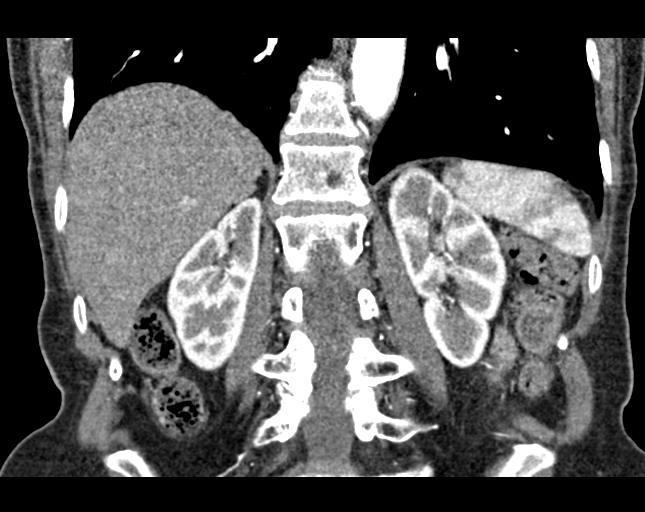

[10 of 46 positions shown; findings below may reference images not displayed]

FINDINGS: ARTERIAL FINDINGS:

Aorta: Normal caliber of the distal descending thoracic aorta and
the abdominal aorta. Small amount of atherosclerotic plaque in the
abdominal aorta.

Celiac axis: Celiac trunk and main branch vessels are patent. Mild
narrowing and irregularity involving the proximal gastroduodenal
artery on sequence 4, image 35.

Superior mesenteric: Atherosclerotic plaque at the origin of the SMA
without significant stenosis.

Left renal: Left renal artery is widely patent without significant
plaque.

Right renal: Right renal artery is patent but there is diffuse
narrowing and irregularity in the mid and distal segment of the
right renal artery. This "beaded" configuration is suggestive for
fibromuscular dysplasia.Accessory right renal artery originating
from the distal abdominal aorta.

Inferior mesenteric:  Inferior mesenteric artery is patent.

Left iliac: There appears to be an irregular dissection involving
the left common iliac artery which is incompletely evaluated. Left
common iliac artery measures up to 1.2 cm.

Right iliac: Proximal right common iliac artery has a normal
caliber.

Venous findings: Hepatic veins and portal venous system are patent.
No gross abnormality to the IVC or renal veins.

Review of the MIP images confirms the above findings.

Nonvascular findings:

Lung bases:  Lung bases are clear without pleural effusions.

Hepatobiliary: Minimal dilatation of the biliary system. Common bile
duct measures up to 6 mm which is normal for age. No acute
abnormality in the liver or gallbladder.

Pancreas: Normal appearance of the pancreas without inflammation or
duct dilatation.

Spleen: Normal appearance of spleen without enlargement.

Adrenals and kidneys: No suspicious renal lesions. Evidence for tiny
renal cysts. Normal appearance of the adrenal glands. No
hydronephrosis.

Lymphatics:  No suspicious lymphadenopathy.

Bowel structures:  No significant bowel dilatation.

Musculoskeletal: No acute abnormality. Focal lucency in the
posterior T12 vertebral body is nonspecific and could represent
focal osteopenia.
IMPRESSION: Irregularity and narrowing of the mid and distal right renal artery.
The configuration is most compatible with fibromuscular dysplasia
(FMD). If there is clinical concern for renovascular hypertension,
consider consultation for endovascular treatment.

Partially visualized complex dissection involving the left common
iliac artery. Dissections can be associated with underlying FMD.
This artery could be characterized with CTA of the pelvis. Mild
irregularity involving the gastroduodenal artery and cannot exclude
FMD at this location.

No acute abnormality involving the nonvascular structures.

## 2017-05-15 ENCOUNTER — Other Ambulatory Visit: Payer: Self-pay | Admitting: Oncology

## 2018-01-02 ENCOUNTER — Other Ambulatory Visit: Payer: Medicare Other

## 2018-01-02 ENCOUNTER — Ambulatory Visit: Payer: Medicare Other | Admitting: Oncology

## 2018-12-25 ENCOUNTER — Telehealth: Payer: Self-pay | Admitting: Physician Assistant

## 2018-12-25 ENCOUNTER — Other Ambulatory Visit (INDEPENDENT_AMBULATORY_CARE_PROVIDER_SITE_OTHER): Payer: Medicare Other

## 2018-12-25 ENCOUNTER — Ambulatory Visit (INDEPENDENT_AMBULATORY_CARE_PROVIDER_SITE_OTHER): Payer: Medicare Other | Admitting: Physician Assistant

## 2018-12-25 ENCOUNTER — Other Ambulatory Visit: Payer: Self-pay

## 2018-12-25 ENCOUNTER — Encounter: Payer: Self-pay | Admitting: Physician Assistant

## 2018-12-25 VITALS — BP 146/68 | HR 72 | Ht 65.5 in | Wt 118.4 lb

## 2018-12-25 DIAGNOSIS — R197 Diarrhea, unspecified: Secondary | ICD-10-CM | POA: Diagnosis not present

## 2018-12-25 DIAGNOSIS — K922 Gastrointestinal hemorrhage, unspecified: Secondary | ICD-10-CM

## 2018-12-25 DIAGNOSIS — K2971 Gastritis, unspecified, with bleeding: Secondary | ICD-10-CM | POA: Diagnosis not present

## 2018-12-25 DIAGNOSIS — D508 Other iron deficiency anemias: Secondary | ICD-10-CM | POA: Diagnosis not present

## 2018-12-25 DIAGNOSIS — K529 Noninfective gastroenteritis and colitis, unspecified: Secondary | ICD-10-CM | POA: Diagnosis not present

## 2018-12-25 LAB — COMPREHENSIVE METABOLIC PANEL
ALT: 12 U/L (ref 0–35)
AST: 16 U/L (ref 0–37)
Albumin: 4 g/dL (ref 3.5–5.2)
Alkaline Phosphatase: 101 U/L (ref 39–117)
BUN: 25 mg/dL — AB (ref 6–23)
CO2: 30 mEq/L (ref 19–32)
Calcium: 9.3 mg/dL (ref 8.4–10.5)
Chloride: 103 mEq/L (ref 96–112)
Creatinine, Ser: 0.69 mg/dL (ref 0.40–1.20)
GFR: 81.95 mL/min (ref >60.00)
Glucose, Bld: 87 mg/dL (ref 70–99)
Potassium: 3.8 mEq/L (ref 3.5–5.1)
Sodium: 141 mEq/L (ref 135–145)
Total Bilirubin: 0.4 mg/dL (ref 0.2–1.2)
Total Protein: 6.9 g/dL (ref 6.0–8.3)

## 2018-12-25 LAB — IBC + FERRITIN
Ferritin: 6.6 ng/mL — ABNORMAL LOW (ref 10.0–291.0)
Iron: 33 ug/dL — ABNORMAL LOW (ref 42–145)
Saturation Ratios: 6.3 % — ABNORMAL LOW (ref 20.0–50.0)
TRANSFERRIN: 373 mg/dL — AB (ref 212.0–360.0)

## 2018-12-25 LAB — CBC WITH DIFFERENTIAL/PLATELET
Basophils Absolute: 0 10*3/uL (ref 0.0–0.1)
Basophils Relative: 0.6 % (ref 0.0–3.0)
Eosinophils Absolute: 0.2 10*3/uL (ref 0.0–0.7)
Eosinophils Relative: 2.4 % (ref 0.0–5.0)
HCT: 32.3 % — ABNORMAL LOW (ref 36.0–46.0)
Hemoglobin: 10.3 g/dL — ABNORMAL LOW (ref 12.0–15.0)
Lymphocytes Relative: 20.6 % (ref 12.0–46.0)
Lymphs Abs: 1.3 10*3/uL (ref 0.7–4.0)
MCHC: 31.9 g/dL (ref 30.0–36.0)
MCV: 74.5 fl — ABNORMAL LOW (ref 78.0–100.0)
Monocytes Absolute: 0.4 10*3/uL (ref 0.1–1.0)
Monocytes Relative: 6.9 % (ref 3.0–12.0)
Neutro Abs: 4.5 10*3/uL (ref 1.4–7.7)
Neutrophils Relative %: 69.5 % (ref 43.0–77.0)
Platelets: 213 10*3/uL (ref 150.0–400.0)
RBC: 4.34 Mil/uL (ref 3.87–5.11)
RDW: 16.2 % — ABNORMAL HIGH (ref 11.5–15.5)
WBC: 6.5 10*3/uL (ref 4.0–10.5)

## 2018-12-25 MED ORDER — SUPREP BOWEL PREP KIT 17.5-3.13-1.6 GM/177ML PO SOLN: 1.0000 | ORAL | 0 refills | Status: DC

## 2018-12-25 NOTE — Telephone Encounter (Signed)
The pt advised that the result was sent to PCP.

## 2018-12-25 NOTE — Telephone Encounter (Signed)
Pt has a question about blood work that was done today. She is wanting to know if the results will be sent to her primary. She would like to speak with the nurse today if possible.

## 2018-12-25 NOTE — Patient Instructions (Signed)
You have been scheduled for a colonoscopy. Please follow written instructions given to you at your visit today.  Please pick up your prep supplies at the pharmacy within the next 1-3 days. If you use inhalers (even only as needed), please bring them with you on the day of your procedure. Your physician has requested that you go to www.startemmi.com and enter the access code given to you at your visit today. This web site gives a general overview about your procedure. However, you should still follow specific instructions given to you by our office regarding your preparation for the procedure.   Your provider has requested that you go to the basement level for lab work before leaving today. Press "B" on the elevator. The lab is located at the first door on the left as you exit the elevator.   

## 2018-12-25 NOTE — Progress Notes (Signed)
Agree with assessment and plan as outlined. Hopefully she can proceed with the colonoscopy as soon as possible to further evaluate this, she is long overdue after prior recommendations to have this done sooner.

## 2018-12-25 NOTE — Progress Notes (Signed)
Chief Complaint: GI bleed, Chronic diarrhea  HPI:    Angel Palmer is a 78 year old Caucasian female, known to Dr. Havery Moros, with a past medical history of breast cancer and endometrial cancer status post radiation as well as others listed below, who was referred to me by Kennieth Rad, MD for a complaint of recent GI bleed and diarrhea.      08/16/2016 colonoscopy for IDA.  Preparation of the colon was poor and inadequate for screening purposes, however no mass lesions or large polyps noted, single nonbleeding colonic angiodysplastic lesion, exam otherwise normal.  Is recommended patient have repeat colonoscopy for screening purposes due to poor bowel prep.  EGD on the same day with a few gastric polyps, normal duodenal bulb and second portion of duodenum.  There is no obvious cause for iron deficiency.  Pathology returned negative/normal with a few benign small gastric hyperplastic polyps.  Is recommended she have a repeat colonoscopy.    12/06/2018 ER visit for rectal bleeding.  At that time rectal exam noted to be unremarkable.  Patient described loose bowel with bright red blood.  CBC showed hemoglobin of 10.0, MCV low at 79.  Otherwise normal.  BMP with a BUN minimally elevated at 21, potassium low at 3.2 and otherwise normal.  CT abdomen pelvis with contrast showed moderate volume colonic stool burden, extensive cystic changes in the right acetabulum thought to be benign and otherwise normal    12/07/2018 patient seen again in the ER.  Describes another episode of bloody stool after being discharged the day before.  12/07/2018 CBC with a hemoglobin of 10.3, MCV low at 78 and otherwise normal.    12/12/2018 CBC with a hemoglobin of 9.8, MCV low at 78.  CMP with a BUN elevated at 23, potassium low at 3.4 and otherwise normal.    Today, patient presents clinic accompanied by her daughter.  She describes that on 12/06/2018 she had loose bowel which is not abnormal for her but but this was accompanied by  a large amount of bright red blood.  She proceeded to the ER as above.  She then followed up the next day due to another loose stool with blood around 11 PM later that night after being discharged from the ER.  She has had none since.  Patient tells me that she has chronic loose stool noting at least 12-15 urgent loose stools with abdominal cramping over the past 2 years.  She does use Imodium multiple times a day and tells me she might go through "a box a day".  Apparently she has had this evaluated within the past year at her PCP with some stool studies which she tells me were normal/negative.  Explains that she has a history of endometrial cancer for which she had 30 rounds of radiation and also has a history of breast cancer 3 years ago with more radiation and since that time is had trouble with diarrhea.  Has always blamed this on the radiation.    Denies fever, chills, weight loss, further episodes of GI bleed or symptoms that awaken her from sleep.     Past Medical History:  Diagnosis Date  . Anemia   . Anxiety   . Breast cancer ([place])   . Chronic low back pain   . Depression   . Depression with anxiety   . Endometrial cancer ([place])   . Endometrial/uterine adenocarcinoma ([place])   . High cholesterol   . Hypertension   . Lytic bone lesion of hip   .  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 Neuromuscular disorder ([facility])    neuropathy in feet  . 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 Osteoarthritis   . Pneumonia     Past Surgical History:  Procedure Laterality Date  . BREAST LUMPECTOMY WITH RADIOACTIVE SEED AND SENTINEL LYMPH NODE BIOPSY Left 08/04/2015   Procedure: last finished up in March 2017 LEFT BREAST LUMPECTOMY WITH RADIOACTIVE SEED AND SENTINEL LYMPH NODE BIOPSY;  Surgeon: Coralie Keens, MD;  Location: [facility];  Service: General;  Laterality: Left;  . HAND SURGERY Left    Thumb  . TOTAL ABDOMINAL HYSTERECTOMY W/ BILATERAL SALPINGOOPHORECTOMY      1995    Current Outpatient Medications  Medication Sig Dispense Refill  . Calcium-Vitamin D (CALTRATE 600 PLUS-VIT D PO) Take 600 mg by mouth as needed.    . DULoxetine (CYMBALTA) 30 MG capsule Take 30 mg by mouth daily.    . pravastatin (PRAVACHOL) 10 MG tablet Take 10 mg by mouth daily. Reported on 01/11/2016    . triamcinolone (NASACORT) 55 MCG/ACT AERO nasal inhaler Place 2 sprays into the nose as needed. Reported on 01/11/2016     Current Facility-Administered Medications  Medication Dose Route Frequency Provider Last Rate Last Dose  . 0.9 %  sodium chloride infusion  500 mL Intravenous Continuous Armbruster, Carlota Raspberry, MD        Allergies as of 12/25/2018 - Review Complete 12/25/2018  Allergen Reaction Noted  . Aspercreme lidocaine [lidocaine] Rash 08/04/2015  . Cephalosporins Rash 07/06/2015  . Ciprofloxacin Rash 07/06/2015  . Penicillins Rash 07/06/2015    Family History  Problem Relation Age of Onset  . Stroke Father   . Heart disease Father   . Colon cancer Mother   . Ovarian cancer Sister   . Breast cancer Sister   . Breast cancer Sister   . Colon cancer Other        nephew and neice  . Breast cancer Other        neice    Social History   Socioeconomic History  . Marital status: Married    Spouse name: Not on file  . Number of children: 1  . Years of education: Not on file  . Highest education level: Not on file  Occupational History  . Occupation: retired Medical laboratory scientific officer  . Financial resource strain: Not on file  . Food insecurity:    Worry: Not on file    Inability: Not on file  . Transportation needs:    Medical: Not on file    Non-medical: Not on file  Tobacco Use  . Smoking status: Never Smoker  . Smokeless tobacco: Never Used  Substance and Sexual Activity  . Alcohol use: No  . Drug use: No  . Sexual activity: Not Currently  Lifestyle  . Physical activity:    Days per week: Not on file    Minutes per session: Not on file  .  Stress: Not on file  Relationships  . Social connections:    Talks on phone: Not on file    Gets together: Not on file    Attends religious service: Not on file    Active member of club or organization: Not on file    Attends meetings of clubs or organizations: Not on file    Relationship status: Not on file  . Intimate partner violence:    Fear of current or ex partner: Not on  file    Emotionally abused: Not on file    Physically abused: Not on file    Forced sexual activity: Not on file  Other Topics Concern  . Not on file  Social History Narrative  . Not on file    Review of Systems:    Constitutional: No weight loss, fever, chills, weakness or fatigue HEENT: Eyes: No change in vision               Ears, Nose, Throat:  No change in hearing or congestion Skin: No rash or itching Cardiovascular: No chest pain, chest pressure or palpitations   Respiratory: No SOB or cough Gastrointestinal: See HPI and otherwise negative Genitourinary: No dysuria or change in urinary frequency Neurological: No headache, dizziness or syncope Musculoskeletal: No new muscle or joint pain Hematologic: No bleeding or bruising Psychiatric: No history of depression or anxiety    Physical Exam:  Vital signs: BP (!) 146/68   Pulse 72   Ht 5' 5.5" (1.664 m)   Wt 118 lb 6.4 oz (53.7 kg)   BMI 19.40 kg/m   Constitutional:   Pleasant Caucasian female appears to be in NAD, Well developed, Well nourished, alert and cooperative Head:  Normocephalic and atraumatic. Eyes:   PEERL, EOMI. No icterus. Conjunctiva pink. Ears:  Normal auditory acuity. Neck:  Supple Throat: Oral cavity and pharynx without inflammation, swelling or lesion.  Respiratory: Respirations even and unlabored. Lungs clear to auscultation bilaterally.   No wheezes, crackles, or rhonchi.  Cardiovascular: Normal S1, S2. No MRG. Regular rate and rhythm. No peripheral edema, cyanosis or pallor.  Gastrointestinal:  Soft, nondistended,  nontender. No rebound or guarding. Normal bowel sounds. No appreciable masses or hepatomegaly. Rectal:  Not performed.  Msk:  Symmetrical without gross deformities. Without edema, no deformity or joint abnormality.  Neurologic:  Alert and  oriented x4;  grossly normal neurologically.  Skin:   Dry and intact without significant lesions or rashes. Psychiatric: Oriented to person, place and time. Demonstrates good judgement and reason without abnormal affect or behaviors.  RELEVANT LABS AND IMAGING: CBC    Component Value Date/Time   WBC 5.8 01/01/2017 1336   WBC 5.8 08/16/2016 1623   RBC 4.02 01/01/2017 1336   RBC 4.40 08/16/2016 1623   HGB 11.4 (L) 01/01/2017 1336   HCT 34.3 (L) 01/01/2017 1336   PLT 181 01/01/2017 1336   MCV 85.3 01/01/2017 1336   MCH 28.2 01/01/2017 1336   MCHC 33.1 01/01/2017 1336   MCHC 32.1 08/16/2016 1623   RDW 14.5 01/01/2017 1336   LYMPHSABS 1.5 01/01/2017 1336   MONOABS 0.5 01/01/2017 1336   EOSABS 0.1 01/01/2017 1336   BASOSABS 0.0 01/01/2017 1336    CMP     Component Value Date/Time   NA 143 01/01/2017 1336   K 4.2 01/01/2017 1336   CO2 29 01/01/2017 1336   GLUCOSE 114 01/01/2017 1336   BUN 20.9 01/01/2017 1336   CREATININE 0.9 01/01/2017 1336   CALCIUM 9.1 01/01/2017 1336   PROT 6.4 01/01/2017 1336   ALBUMIN 3.4 (L) 01/01/2017 1336   AST 27 01/01/2017 1336   ALT 31 01/01/2017 1336   ALKPHOS 88 01/01/2017 1336   BILITOT 0.31 01/01/2017 1336    Assessment: 1.  GI bleed: 12/06/18 x 2 episdoes of loose bloody stool, hgb 9.8 at last check end of feb, BUN elevated, previous egd/colon for IDA with AVM in colon; consider AVM vs hemorrhoids vs other 2. IDA: likely due to above (not sure  what baseline hgb is) 3.  Chronic diarrhea: Consider relation to radiation after endometrial and breast cancer versus IBS versus other vs ibs vs other  Plan: 1.  We will repeat stool studies today.  Ordered GI pathogen panel and O&P.  Patient request this be done  in [place].  She was sent with a lab request.  I have very low/no suspicion that this is infectious as patient has had this going on for 2 years, but will make sure. 2.  Continue Imodium for now, but likely patient would do better with Lomotil and/or antispasmodic in the future.  Pending results of colonoscopy. 3.  Scheduled patient for repeat colonoscopy given that the last one had poor prep and was inadequate.  Provided the patient with a 2-day prep.  This is scheduled in the [facility] with Dr. Havery Moros.  Did discuss risks, benefits, limitations and alternatives and the patient agrees to proceed. 4. Recheck CBC, CMP and Iron studies today-results will need to be faxed to PCP in [place].  Patient to follow in clinic per recommendations from Dr. Havery Moros after time of procedure.  She will likely need follow-up to discuss her diarrhea further.  Ellouise Newer, PA-C [facility] 12/25/2018, 9:01 AM  Cc: Kennieth Rad, MD

## 2019-01-29 ENCOUNTER — Encounter: Payer: Medicare Other | Admitting: Gastroenterology

## 2024-02-07 ENCOUNTER — Encounter: Payer: Self-pay | Admitting: *Deleted

## 2024-02-14 ENCOUNTER — Encounter: Payer: Self-pay | Admitting: Internal Medicine

## 2024-02-14 ENCOUNTER — Ambulatory Visit: Payer: No Typology Code available for payment source | Admitting: Internal Medicine

## 2024-02-14 VITALS — BP 169/82 | HR 67 | Temp 96.8°F | Ht 65.0 in | Wt 110.0 lb

## 2024-02-14 DIAGNOSIS — K529 Noninfective gastroenteritis and colitis, unspecified: Secondary | ICD-10-CM

## 2024-02-14 DIAGNOSIS — R109 Unspecified abdominal pain: Secondary | ICD-10-CM

## 2024-02-14 DIAGNOSIS — R152 Fecal urgency: Secondary | ICD-10-CM

## 2024-02-14 DIAGNOSIS — R197 Diarrhea, unspecified: Secondary | ICD-10-CM

## 2024-02-14 DIAGNOSIS — K862 Cyst of pancreas: Secondary | ICD-10-CM | POA: Diagnosis not present

## 2024-02-14 MED ORDER — DIPHENOXYLATE-ATROPINE 2.5-0.025 MG PO TABS: 1.0000 | ORAL_TABLET | Freq: Two times a day (BID) | ORAL | 0 refills | Status: AC | PRN

## 2024-02-14 NOTE — Patient Instructions (Addendum)
 We will need to proceed with MRI of your pancreas.  Let me know if I need to order this.  For your chronic diarrhea, I am going to send in Lomotil  to take 1-2 times daily.  Given your age you might be more sensitive to this medication.  Side effects to be cognizant about would be dry mouth, dry eyes, confusion.  That being said I am sending in a very low dose.  Follow-up in 4 to 6 weeks.  It was very nice meeting both you today.  Dr. Mordechai April

## 2024-02-14 NOTE — Progress Notes (Signed)
 Primary Care Physician:  Tressie Fryer, MD Primary Gastroenterologist:  Dr. Mordechai April  Chief Complaint  Patient presents with   New Patient (Initial Visit)    Pt referred for pancreatic cyst    HPI:   Angel Palmer is a 83 y.o. female who presents to clinic today by referral from her PCP Dr. Dione Franks for evaluation.  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  She states she was worked up and discharged home.  She mentions that she had a CT that showed a pancreatic cyst.  She has an MRI ordered to further evaluate.  They bring the CD for me today though I do not have an official CT report.  Patient denies any history of pancreatitis in the past that she is aware of.  Has had multiple malignancies including uterine and breast.  Has undergone numerous radiation treatments in the past though has been over 10 years.  Chronic diarrhea: Has been an issue for her for over 10 years.  Takes Imodium 2-3 times daily.  It does not help.  Continues to have fecal urgency every time she eats.  Numerous loose bowel movements daily.   Last colonoscopy 2017 which was a poor prep though relatively unremarkable.  Biopsies negative for microscopic colitis.  Denies any melena hematochezia.  Does have abdominal cramping which improves with subsequent bowel movement.  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   Osteoarthritis    Pneumonia     Past Surgical History:  Procedure Laterality Date   BREAST LUMPECTOMY WITH RADIOACTIVE SEED AND  SENTINEL LYMPH NODE BIOPSY Left 08/04/2015   Procedure: last finished up in March 2017 LEFT BREAST LUMPECTOMY WITH RADIOACTIVE SEED AND SENTINEL LYMPH NODE BIOPSY;  Surgeon: Oza Blumenthal, MD;  [facility];  Service: General;  Laterality: Left;   HAND SURGERY Left    Thumb   TOTAL ABDOMINAL HYSTERECTOMY W/ BILATERAL SALPINGOOPHORECTOMY     1995    Current Outpatient Medications  Medication Sig Dispense Refill   Calcium-Vitamin D  (CALTRATE 600 PLUS-VIT D PO) Take 600 mg by mouth as needed.     diphenoxylate -atropine  (LOMOTIL ) 2.5-0.025 MG tablet Take 1 tablet by mouth 2 (two) times daily as needed for diarrhea or loose stools. 60 tablet 0   DULoxetine (CYMBALTA) 30 MG capsule Take 30 mg by mouth daily.     triamcinolone (NASACORT) 55 MCG/ACT AERO nasal inhaler Place 2 sprays into the nose as needed. Reported on 01/11/2016     Current Facility-Administered Medications  Medication Dose Route Frequency Provider Last Rate Last Admin   0.9 %  sodium chloride  infusion  500 mL Intravenous Continuous Armbruster, Lendon Queen, MD        Allergies as of 02/14/2024 - Review Complete 02/14/2024  Allergen Reaction Noted   Aspercreme lidocaine  [lidocaine ] Rash 08/04/2015   Cephalosporins Rash 07/06/2015   Ciprofloxacin  Rash 07/06/2015  Penicillins Rash 07/06/2015    Family History  Problem Relation Age of Onset   Stroke Father    Heart disease Father    Colon cancer Mother    Ovarian cancer Sister    Breast cancer Sister    Breast cancer Sister    Colon cancer Other        nephew and neice   Breast cancer Other        neice    Social History   Socioeconomic History   Marital status: Married    Spouse name: Not on file   Number of children: 1   Years of education: Not on file   Highest education level: Not on file  Occupational History   Occupation: retired Catering manager  Tobacco Use   Smoking status: Never   Smokeless tobacco: Never  Substance and Sexual  Activity   Alcohol use: No   Drug use: No   Sexual activity: Not Currently  Other Topics Concern   Not on file  Social History Narrative   Not on file   Social Drivers of Health   Financial Resource Strain: Not on file  Food Insecurity: Not on file  Transportation Needs: Not on file  Physical Activity: Not on file  Stress: Not on file  Social Connections: Not on file  Intimate Partner Violence: Not on file    Subjective: Review of Systems  Constitutional:  Negative for chills and fever.  HENT:  Negative for congestion and hearing loss.   Eyes:  Negative for blurred vision and double vision.  Respiratory:  Negative for cough and shortness of breath.   Cardiovascular:  Negative for chest pain and palpitations.  Gastrointestinal:  Positive for diarrhea. Negative for abdominal pain, blood in stool, constipation, heartburn, melena and vomiting.  Genitourinary:  Negative for dysuria and urgency.  Musculoskeletal:  Negative for joint pain and myalgias.  Skin:  Negative for itching and rash.  Neurological:  Negative for dizziness and headaches.  Psychiatric/Behavioral:  Negative for depression. The patient is not nervous/anxious.        Objective: BP (!) 169/82   Pulse 67   Temp (!) 96.8 F (36 C)   Ht 5\' 5"  (1.651 m)   Wt 110 lb (49.9 kg)   BMI 18.30 kg/m  Physical Exam Constitutional:      Appearance: Normal appearance.  HENT:     Head: Normocephalic and atraumatic.  Eyes:     Extraocular Movements: Extraocular movements intact.     Conjunctiva/sclera: Conjunctivae normal.  Cardiovascular:     Rate and Rhythm: Normal rate and regular rhythm.  Pulmonary:     Effort: Pulmonary effort is normal.     Breath sounds: Normal breath sounds.  Abdominal:     General: Bowel sounds are normal.     Palpations: Abdomen is soft.  Musculoskeletal:        General: No swelling. Normal range of motion.     Cervical back: Normal range of motion and neck supple.  Skin:     General: Skin is warm and dry.     Coloration: Skin is not jaundiced.  Neurological:     General: No focal deficit present.     Mental Status: She is alert and oriented to person, place, and time.  Psychiatric:        Mood and Affect: Mood normal.        Behavior: Behavior normal.      Assessment/Plan:  1.  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  Agree with proceeding with MRI pancreas to further evaluate.  She states this is already been ordered, if we need to help with this then she will let us  know.  Further recommendations after MRI reviewed.  2.  Chronic diarrhea with fecal urgency-colonoscopy 2017 biopsies negative for microscopic colitis.  Will request CT report as above.  Imodium not helping.  Will trial on Lomotil .  Counseled on side effect profile as well as her sensitivity given her age.  She is agreeable.  Gallbladder in situ, can consider trial of Viberzi though need to review CT of her pancreas first.   Follow-up in 4 to 6 weeks  02/14/2024 4:38 PM   Disclaimer: This note was dictated with voice recognition software. Similar sounding words can inadvertently be transcribed and may not be corrected upon review.

## 2024-03-13 ENCOUNTER — Encounter: Payer: Self-pay | Admitting: Internal Medicine
